# Patient Record
Sex: Male | Born: 1935 | Race: Black or African American | Hispanic: No | Smoking: Former smoker
Health system: Southern US, Community
[De-identification: ages and names within clinical notes are randomized; demographics above are authoritative.]

## PROBLEM LIST (undated history)

## (undated) DIAGNOSIS — I10 Essential (primary) hypertension: Secondary | ICD-10-CM

## (undated) DIAGNOSIS — E785 Hyperlipidemia, unspecified: Secondary | ICD-10-CM

## (undated) DIAGNOSIS — I519 Heart disease, unspecified: Secondary | ICD-10-CM

## (undated) HISTORY — PX: CORONARY ANGIOPLASTY: SHX604

---

## 2005-08-16 ENCOUNTER — Ambulatory Visit: Payer: Self-pay | Admitting: Gastroenterology

## 2006-01-12 ENCOUNTER — Emergency Department: Payer: Self-pay | Admitting: Internal Medicine

## 2009-11-17 ENCOUNTER — Emergency Department: Payer: Self-pay | Admitting: Emergency Medicine

## 2012-04-19 ENCOUNTER — Emergency Department: Payer: Self-pay | Admitting: Unknown Physician Specialty

## 2012-04-19 LAB — CBC
HCT: 37.7 % — ABNORMAL LOW (ref 40.0–52.0)
HGB: 11.9 g/dL — ABNORMAL LOW (ref 13.0–18.0)
MCHC: 31.7 g/dL — ABNORMAL LOW (ref 32.0–36.0)
MCV: 78 fL — ABNORMAL LOW (ref 80–100)
Platelet: 84 10*3/uL — ABNORMAL LOW (ref 150–440)
RBC: 4.85 10*6/uL (ref 4.40–5.90)
WBC: 3.9 10*3/uL (ref 3.8–10.6)

## 2012-04-19 LAB — URINALYSIS, COMPLETE
Glucose,UR: NEGATIVE mg/dL (ref 0–75)
Ketone: NEGATIVE
Leukocyte Esterase: NEGATIVE
Nitrite: NEGATIVE
Ph: 6 (ref 4.5–8.0)
Protein: NEGATIVE
RBC,UR: 1 /HPF (ref 0–5)
WBC UR: <1 /HPF (ref 0–5)

## 2012-04-19 LAB — COMPREHENSIVE METABOLIC PANEL
Alkaline Phosphatase: 63 U/L (ref 50–136)
Anion Gap: 7 (ref 7–16)
Calcium, Total: 8 mg/dL — ABNORMAL LOW (ref 8.5–10.1)
Chloride: 105 mmol/L (ref 98–107)
Creatinine: 1.29 mg/dL (ref 0.60–1.30)
Osmolality: 271 (ref 275–301)
Potassium: 4.2 mmol/L (ref 3.5–5.1)
SGPT (ALT): 24 U/L (ref 12–78)
Sodium: 134 mmol/L — ABNORMAL LOW (ref 136–145)
Total Protein: 6.7 g/dL (ref 6.4–8.2)

## 2012-04-19 LAB — TROPONIN I: Troponin-I: <0.02 ng/mL

## 2012-04-19 LAB — ETHANOL: Ethanol %: 0.076 % (ref 0.000–0.080)

## 2013-10-09 IMAGING — CT CT HEAD WITHOUT CONTRAST
1 series · 16 of 30 positions shown, 20 images · non-contrast
Comparison: none

REASON FOR EXAM: syncope
COMMENTS:

PROCEDURE:     CT  - CT HEAD WITHOUT CONTRAST  - April 19, 2012  [DATE]
RESULT:     Comparison:  None
TECHNIQUE: Multiple axial images from the foramen magnum to the vertex were
obtained without IV contrast.

[Series 2: soft tissue · axial · 0.44mm/px · z∈[+286,+426]mm · 16 of 32 slices shown, 20 images]
[im 2/32  brain]
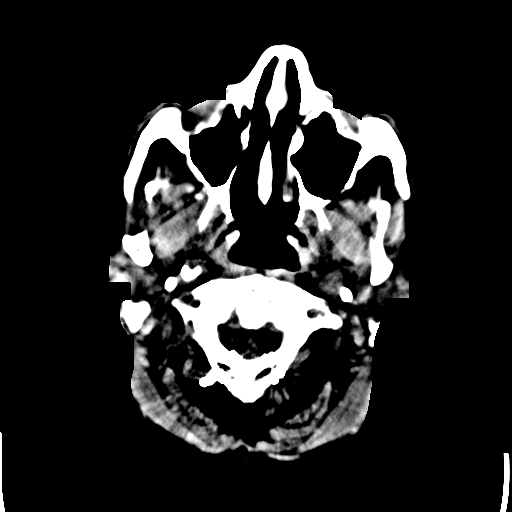
[im 2/32  bone]
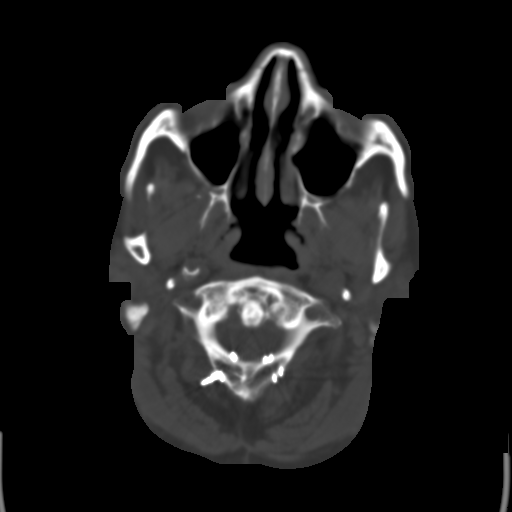
[im 4/32  brain]
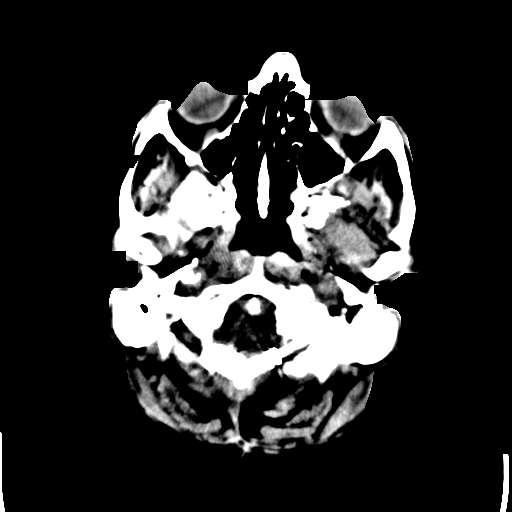
[im 6/32  brain]
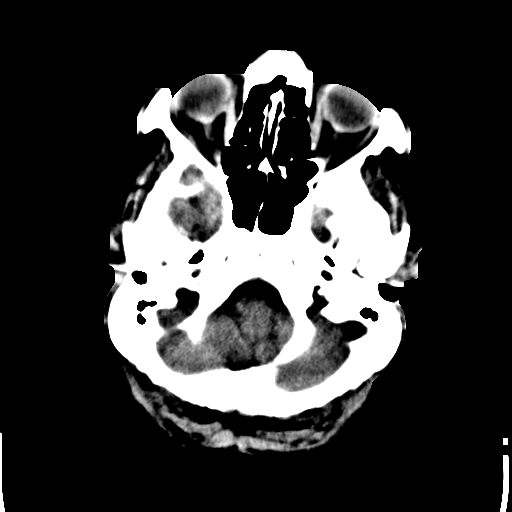
[im 8/32  brain]
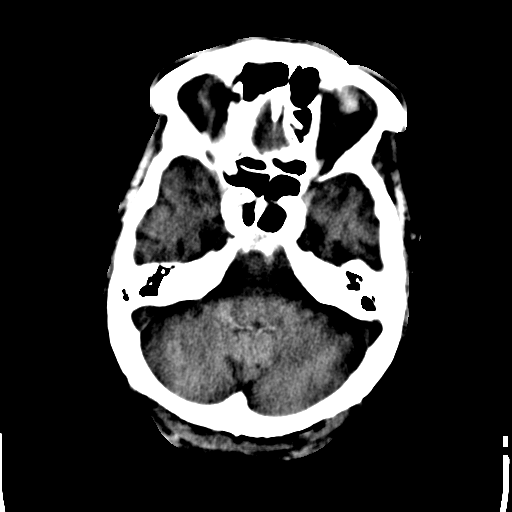
[im 9/32  brain]
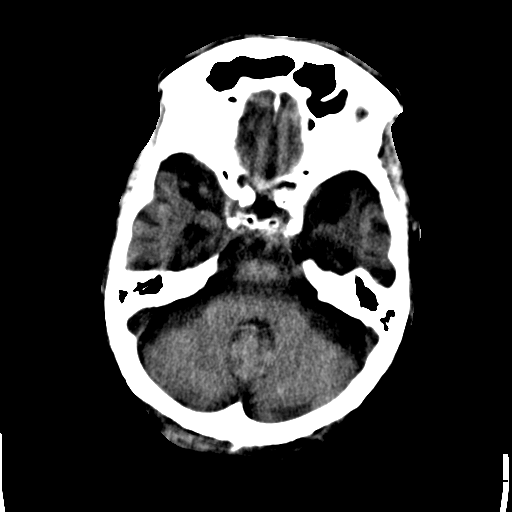
[im 9/32  bone]
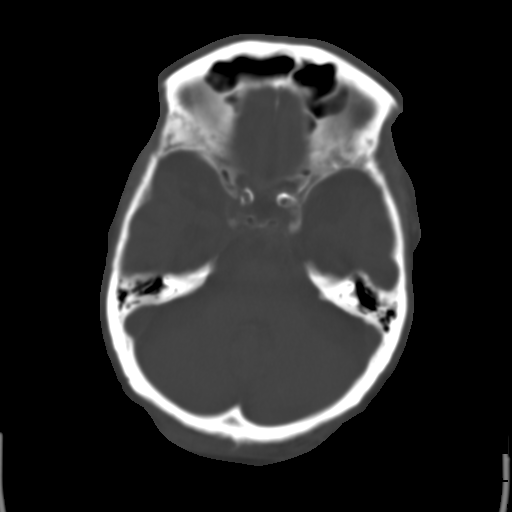
[im 11/32  brain]
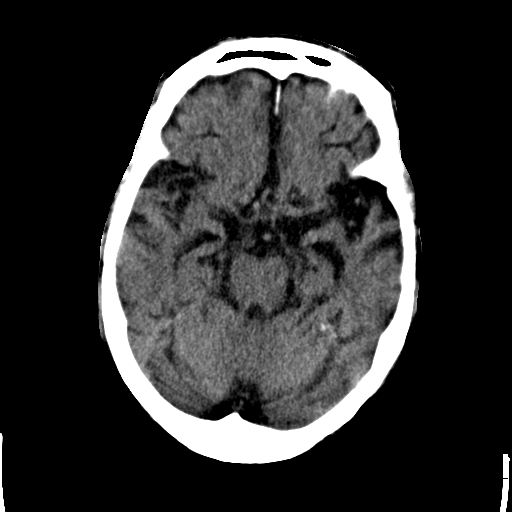
[im 13/32  brain]
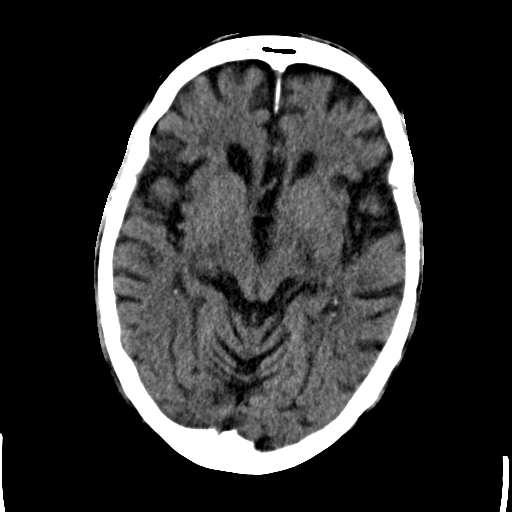
[im 15/32  brain]
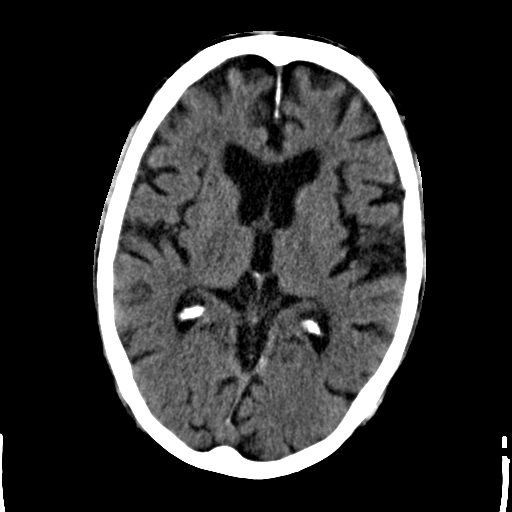
[im 17/32  brain]
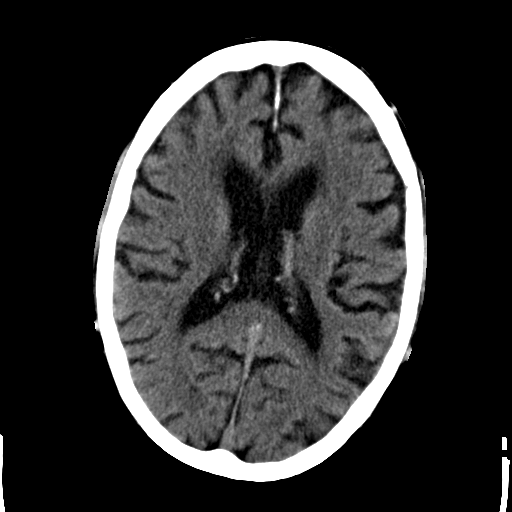
[im 17/32  bone]
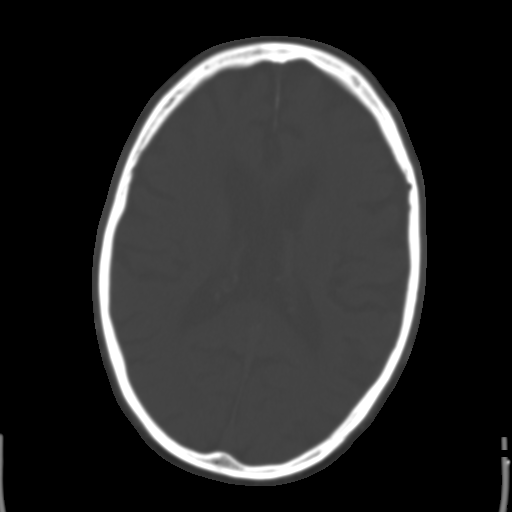
[im 19/32  brain]
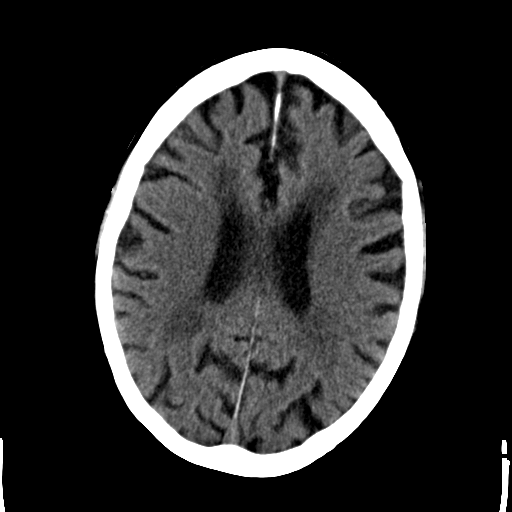
[im 21/32  brain]
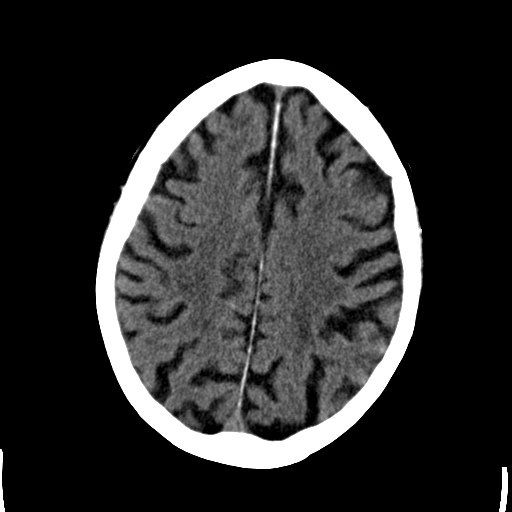
[im 23/32  brain]
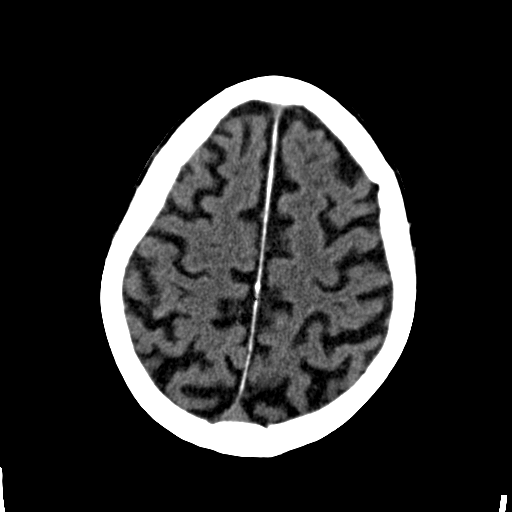
[im 24/32  brain]
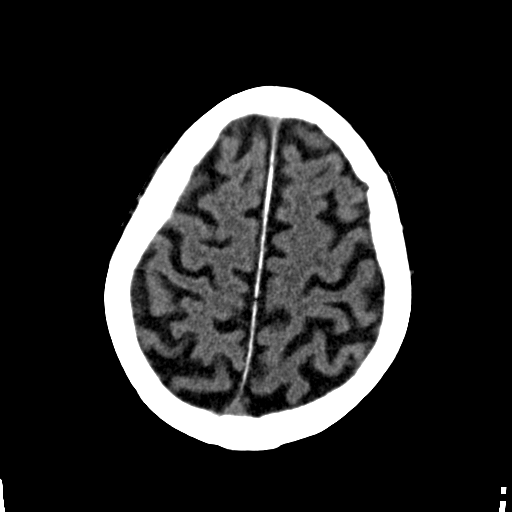
[im 24/32  bone]
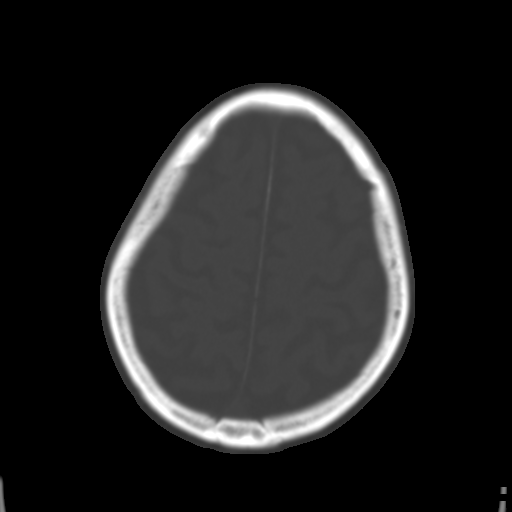
[im 26/32  brain]
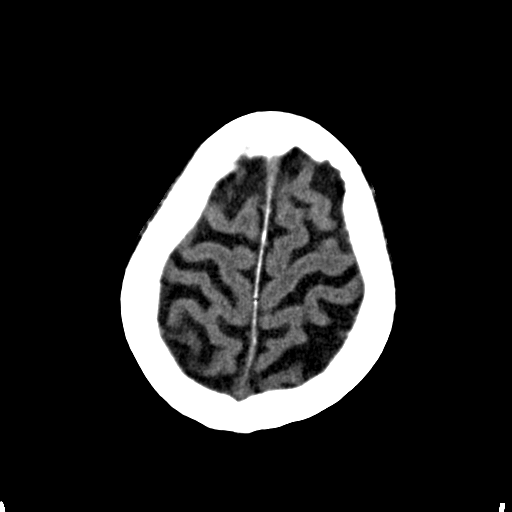
[im 28/32  brain]
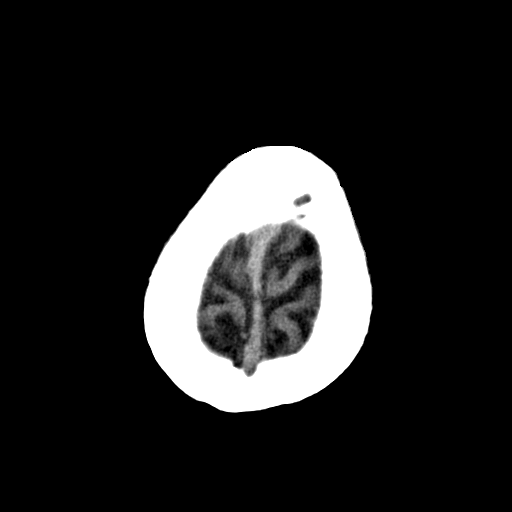
[im 30/32  brain]
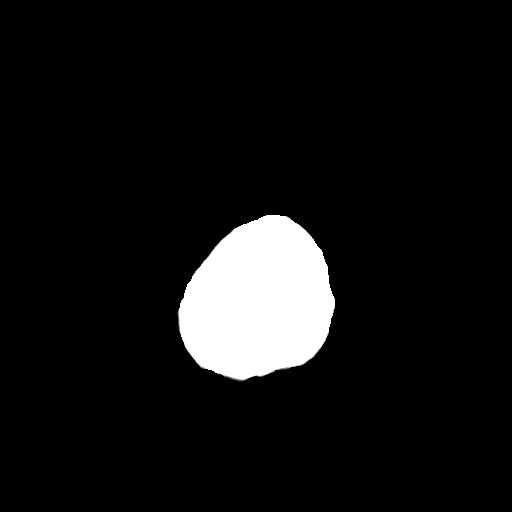

[16 of 30 positions shown; findings below may reference images not displayed]

FINDINGS: There is no evidence for mass effect, midline shift, or extra-axial fluid
collections. There is no evidence for space-occupying lesion, intracranial
hemorrhage, or cortical-based area of infarction. Periventricular and
subcortical hypoattenuation is consistent with chronic small vessel ischemic
disease. There is mild cerebral atrophy.

Cerclage wires are incompletely visualized around the posterior arches of
C1-C2.
IMPRESSION: 1. No acute intracranial process.
2. Chronic small vessel ischemic disease.

[REDACTED]

## 2013-12-23 ENCOUNTER — Other Ambulatory Visit: Payer: Self-pay | Admitting: Obstetrics and Gynecology

## 2014-09-23 ENCOUNTER — Encounter: Payer: Medicare Other | Attending: Cardiothoracic Surgery | Admitting: *Deleted

## 2014-09-23 VITALS — Ht 68.6 in | Wt 144.0 lb

## 2014-09-23 DIAGNOSIS — Z951 Presence of aortocoronary bypass graft: Secondary | ICD-10-CM | POA: Diagnosis not present

## 2014-09-23 NOTE — Progress Notes (Signed)
Daily Session Note  Patient Details  Name: Travis KOUDELKA Sr. MRN: [id number] Date of Birth: 04-12-1931 Referring Provider:  Gustavo Lah, MD  Encounter Date: 09/23/2014  Check In:     Session Check In - 09/23/14 1025    Check-In   Staff Present Gerlene Burdock RN, Drusilla Kanner MS, ACSM CEP Exercise Physiologist   ER physicians immediately available to respond to emergencies See telemetry face sheet for immediately available ER MD   Medication changes reported     No   Fall or balance concerns reported    No   Warm-up and Cool-down Performed on first and last piece of equipment   VAD Patient? No   Pain Assessment   Currently in Pain? No/denies         Goals Met:  Travis Webb did the 6 minute walk test today with no problems.   Goals Unmet:  Not Applicable  Goals Comments: Travis Webb was bought in by his daughter. Travis Webb is ready to exercise.    Dr. Emily Filbert is Medical Director for [facility].

## 2014-09-23 NOTE — Patient Instructions (Signed)
Patient Instructions  Patient Details  Name: Travis STARNER Sr. MRN: [id number] Date of Birth: August 14, 1931 Referring Provider:  Lottie Dawson, MD  Below are the personal goals you chose as well as exercise and nutrition goals. Our goal is to help you keep on track towards obtaining and maintaining your goals. We will be discussing your progress on these goals with you throughout the program.  Initial Exercise Prescription:   Exercise Goals: Frequency: Be able to perform aerobic exercise three times per week working toward 3-5 days per week.  Intensity: Work with a perceived exertion of 11 (fairly light) - 15 (hard) as tolerated. Follow your new exercise prescription and watch for changes in prescription as you progress with the program. Changes will be reviewed with you when they are made.  Duration: You should be able to do 30 minutes of continuous aerobic exercise in addition to a 5 minute warm-up and a 5 minute cool-down routine.  Nutrition Goals: Your personal nutrition goals will be established when you do your nutrition analysis with the dietician.  The following are nutrition guidelines to follow: Cholesterol < /day Sodium < /day Fiber: Men under 50 yrs - 38 grams per day  Personal Goals:     Personal Goals and Risk Factors at Admission - 09/23/14 1027    Personal Goals and Risk Factors on Admission    Weight Management Yes   Intervention Learn and follow the exercise and diet guidelines while in the program. Utilize the nutrition and education classes to help gain knowledge of the diet and exercise expectations in the program  Needs to gain weight   Increase Aerobic Exercise and Physical Activity Yes   Hypertension Yes   Goal Participant will see blood pressure controlled within the values of 140/44mm/Hg or within value directed by their physician.   Intervention Provide nutrition & aerobic exercise along with prescribed medications to achieve BP 140/90 or  less.   Lipids Yes   Goal Cholesterol controlled with medications as prescribed, with individualized exercise RX and with personalized nutrition plan. Value goals: LDL < , HDL > . Participant states understanding of desired cholesterol values and following prescriptions.   Intervention Provide nutrition & aerobic exercise along with prescribed medications to achieve LDL 70mg , HDL >40mg .      Tobacco Use Initial Evaluation: History  Smoking status  . Former Smoker -- 1.50 packs/day for 60 years  . Types: Cigarettes  . Quit date: 11/03/2013  Smokeless tobacco  . Not on file    Copy of goals given to participant.

## 2014-09-23 NOTE — Progress Notes (Signed)
Cardiac Individual Treatment Plan  Patient Details  Name: Travis STRIPLING Sr. MRN: [id number] Date of Birth: [birth year] Referring Provider:  Lottie Dawson, MD  Initial Encounter Date:    Visit Diagnosis: No diagnosis found.  Patient's Home Medications on Admission:  Current outpatient prescriptions:  .  atorvastatin (LIPITOR) 80 MG tablet, Take by mouth., Disp: , Rfl:  .  donepezil (ARICEPT) 5 MG tablet, Take by mouth., Disp: , Rfl:  .  metoprolol tartrate (LOPRESSOR) 25 MG tablet, Take by mouth., Disp: , Rfl:  .  nicotine (RA NICOTINE) 21 mg/24hr patch, Place onto the skin., Disp: , Rfl:  .  nitroGLYCERIN (NITROSTAT) 0.4 MG SL tablet, Place under the tongue., Disp: , Rfl:  .  tamsulosin (FLOMAX) 0.4 MG CAPS capsule, Take by mouth., Disp: , Rfl:   Past Medical History: No past medical history on file.  Tobacco Use: History  Smoking status  . Former Smoker -- 1.50 packs/day for 60 years  . Types: Cigarettes  . Quit date: 11/03/2013  Smokeless tobacco  . Not on file    Labs: Recent Review Flowsheet Data    There is no flowsheet data to display.       Exercise Target Goals:    Exercise Program Goal: Individual exercise prescription set with THRR, safety & activity barriers. Participant demonstrates ability to understand and report RPE using BORG scale, to self-measure pulse accurately, and to acknowledge the importance of the exercise prescription.  Exercise Prescription Goal: Starting with aerobic activity 30 plus minutes a day, 3 days per week for initial exercise prescription. Provide home exercise prescription and guidelines that participant acknowledges understanding prior to discharge.  Activity Barriers & Risk Stratification:     Activity Barriers & Risk Stratification - 09/23/14 1023    Activity Barriers & Risk Stratification   Activity Barriers Other (comment)  Travis Webb has dementia but does follow instructions and was able to do 6 minute walk test.     Risk Stratification High      6 Minute Walk:     6 Minute Walk      09/23/14 0917       6 Minute Walk   Phase Initial     Distance 1140 feet     Walk Time 6 minutes     Resting HR 70 bpm     Resting BP 130/70 mmHg     Max Ex. HR 109 bpm     Max Ex. BP 132/68 mmHg     RPE 15     Symptoms No        Initial Exercise Prescription:   Exercise Prescription Changes:   Discharge Exercise Prescription (Final Exercise Prescription Changes):   Nutrition:  Target Goals: Understanding of nutrition guidelines, daily intake of sodium 1500mg , cholesterol 200mg , calories 30% from fat and 7% or less from saturated fats, daily to have 5 or more servings of fruits and vegetables.  Biometrics:     Pre Biometrics - 09/23/14 0916    Pre Biometrics   Height 5' 8.6" (1.742 m)   Weight 144 lb (65.318 kg)   Waist Circumference 33.75 inches   Hip Circumference 35 inches   Waist to Hip Ratio 0.96 %   BMI (Calculated) 21.6       Nutrition Therapy Plan and Nutrition Goals:     Nutrition Therapy & Goals - 09/23/14 1026    Nutrition Therapy   Diet --  Travis Webb has dementia but his Daughter Travis Webb is intrested in sodium info etc.  Drug/Food Interactions Statins/Certain Fruits   Intervention Plan   Intervention Using nutrition plan and personal goals to gain a healthy nutrition lifestyle. Add exercise as prescribed.      Nutrition Discharge: Rate Your Plate Scores:   Nutrition Goals Re-Evaluation:   Psychosocial: Target Goals: Acknowledge presence or absence of depression, maximize coping skills, provide positive support system. Participant is able to verbalize types and ability to use techniques and skills needed for reducing stress and depression.  Initial Review & Psychosocial Screening:     Initial Psych Review & Screening - 09/23/14 1028    Initial Review   Current issues with Current Sleep Concerns  Travis Webb has dementia. His daughter is taking care of him and her  Grandchild full time.    Family Dynamics   Good Support System? Yes   Screening Interventions   Interventions Encouraged to exercise;Program counselor consult      Quality of Life Scores:   PHQ-9:     Recent Review Flowsheet Data    Depression screen [facility] 2/9 09/23/2014   Decreased Interest 0   Down, Depressed, Hopeless 1   PHQ - 2 Score 1   Altered sleeping 3   Tired, decreased energy 1   Change in appetite 1   Feeling bad or failure about yourself  0   Trouble concentrating 1   Moving slowly or fidgety/restless 1   Suicidal thoughts 0   PHQ-9 Score 8   Difficult doing work/chores Not difficult at all      Psychosocial Evaluation and Intervention:   Psychosocial Re-Evaluation:   Vocational Rehabilitation: Provide vocational rehab assistance to qualifying candidates.   Vocational Rehab Evaluation & Intervention:     Vocational Rehab - 09/23/14 1025    Initial Vocational Rehab Evaluation & Intervention   Assessment shows need for Vocational Rehabilitation No      Education: Education Goals: Education classes will be provided on a weekly basis, covering required topics. Participant will state understanding/return demonstration of topics presented.  Learning Barriers/Preferences:     Learning Barriers/Preferences - 09/23/14 1024    Learning Barriers/Preferences   Learning Barriers Reading;Hearing   Learning Preferences None      Education Topics: General Nutrition Guidelines/Fats and Fiber: -Group instruction provided by verbal, written material, models and posters to present the general guidelines for heart healthy nutrition. Gives an explanation and review of dietary fats and fiber.   Controlling Sodium/Reading Food Labels: -Group verbal and written material supporting the discussion of sodium use in heart healthy nutrition. Review and explanation with models, verbal and written materials for utilization of the food label.   Exercise Physiology &  Risk Factors: - Group verbal and written instruction with models to review the exercise physiology of the cardiovascular system and associated critical values. Details cardiovascular disease risk factors and the goals associated with each risk factor.   Aerobic Exercise & Resistance Training: - Gives group verbal and written discussion on the health impact of inactivity. On the components of aerobic and resistive training programs and the benefits of this training and how to safely progress through these programs.   Flexibility, Balance, General Exercise Guidelines: - Provides group verbal and written instruction on the benefits of flexibility and balance training programs. Provides general exercise guidelines with specific guidelines to those with heart or lung disease. Demonstration and skill practice provided.   Stress Management: - Provides group verbal and written instruction about the health risks of elevated stress, cause of high stress, and healthy ways to reduce stress.  Depression: - Provides group verbal and written instruction on the correlation between heart/lung disease and depressed mood, treatment options, and the stigmas associated with seeking treatment.   Anatomy & Physiology of the Heart: - Group verbal and written instruction and models provide basic cardiac anatomy and physiology, with the coronary electrical and arterial systems. Review of: AMI, Angina, Valve disease, Heart Failure, Cardiac Arrhythmia, Pacemakers, and the ICD.   Cardiac Procedures: - Group verbal and written instruction and models to describe the testing methods done to diagnose heart disease. Reviews the outcomes of the test results. Describes the treatment choices: Medical Management, Angioplasty, or Coronary Bypass Surgery.   Cardiac Medications: - Group verbal and written instruction to review commonly prescribed medications for heart disease. Reviews the medication, class of the drug, and  side effects. Includes the steps to properly store meds and maintain the prescription regimen.   Go Sex-Intimacy & Heart Disease, Get SMART - Goal Setting: - Group verbal and written instruction through game format to discuss heart disease and the return to sexual intimacy. Provides group verbal and written material to discuss and apply goal setting through the application of the S.M.A.R.T. Method.   Other Matters of the Heart: - Provides group verbal, written materials and models to describe Heart Failure, Angina, Valve Disease, and Diabetes in the realm of heart disease. Includes description of the disease process and treatment options available to the cardiac patient.   Exercise & Equipment Safety: - Individual verbal instruction and demonstration of equipment use and safety with use of the equipment.          Cardiac Rehab from 09/23/2014 in [facility]   Date  09/23/14   Educator  C. Janai Maudlin,RN   Instruction Review Code  1- partially meets, needs review/practice      Infection Prevention: - Provides verbal and written material to individual with discussion of infection control including proper hand washing and proper equipment cleaning during exercise session.      Cardiac Rehab from 09/23/2014 in [facility]iac [facility]   Date  09/23/14   Educator  C. Talor Cheema,RN   Instruction Review Code  2- meets goals/outcomes      Falls Prevention: - Provides verbal and written material to individual with discussion of falls prevention and safety.      Cardiac Rehab from 09/23/2014 in [facility]iac [facility]hab   Date  09/23/14   Educator  C. Catheryn Slifer,RN   Instruction Review Code  1- partially meets, needs review/practice      Diabetes: - Individual verbal and written instruction to review signs/symptoms of diabetes, desired ranges of glucose level fasting, after meals and with exercise. Advice that pre and post exercise glucose checks will be done for 3 sessions at entry of  program.    Knowledge Questionnaire Score:     Knowledge Questionnaire Score - 09/23/14 1025    Knowledge Questionnaire Score   Pre Score --  Not done-due to Travis Webb having dementia      Personal Goals and Risk Factors at Admission:     Personal Goals and Risk Factors at Admission - 09/23/14 1027    Personal Goals and Risk Factors on Admission    Weight Management Yes   Intervention Learn and follow the exercise and diet guidelines while in the program. Utilize the nutrition and education classes to help gain knowledge of the diet and exercise expectations in the program  Needs to gain weight   Increase Aerobic Exercise and Physical Activity Yes   Hypertension  Yes   Goal Participant will see blood pressure controlled within the values of 140/90mm/Hg or within value directed by their physician.   Intervention Provide nutrition & aerobic exercise along with prescribed medications to achieve BP 140/90 or less.   Lipids Yes   Goal Cholesterol controlled with medications as prescribed, with individualized exercise RX and with personalized nutrition plan. Value goals: LDL < , HDL > . Participant states understanding of desired cholesterol values and following prescriptions.   Intervention Provide nutrition & aerobic exercise along with prescribed medications to achieve LDL 70mg , HDL >40mg .      Personal Goals and Risk Factors Review:    Personal Goals Discharge (Final Personal Goals and Risk Factors Review):     Comments: Travis Webb should benefit from Cardiac Rehab and increase his strength. Travis Webb will a2mso benefit by having contact with others in the class and activity for him during his days.

## 2014-09-25 DIAGNOSIS — Z951 Presence of aortocoronary bypass graft: Secondary | ICD-10-CM | POA: Diagnosis not present

## 2014-09-25 NOTE — Progress Notes (Signed)
Cardiac Individual Treatment Plan  Patient Details  Name: NAMON VILLARIN Sr. MRN: [id number] Date of Birth: 12-15-1931 Referring Provider:  Lottie Dawson, MD  Initial Encounter Date:    Visit Diagnosis: No diagnosis found.  Patient's Home Medications on Admission:  Current outpatient prescriptions:  .  atorvastatin (LIPITOR) 80 MG tablet, Take by mouth., Disp: , Rfl:  .  donepezil (ARICEPT) 5 MG tablet, Take by mouth., Disp: , Rfl:  .  metoprolol tartrate (LOPRESSOR) 25 MG tablet, Take by mouth., Disp: , Rfl:  .  nicotine (RA NICOTINE) 21 mg/24hr patch, Place onto the skin., Disp: , Rfl:  .  nitroGLYCERIN (NITROSTAT) 0.4 MG SL tablet, Place under the tongue., Disp: , Rfl:  .  tamsulosin (FLOMAX) 0.4 MG CAPS capsule, Take by mouth., Disp: , Rfl:   Past Medical History: No past medical history on file.  Tobacco Use: History  Smoking status  . Former Smoker -- 1.50 packs/day for 60 years  . Types: Cigarettes  . Quit date: 11/03/2013  Smokeless tobacco  . Not on file    Labs: Recent Review Flowsheet Data    There is no flowsheet data to display.       Exercise Target Goals:    Exercise Program Goal: Individual exercise prescription set with THRR, safety & activity barriers. Participant demonstrates ability to understand and report RPE using BORG scale, to self-measure pulse accurately, and to acknowledge the importance of the exercise prescription.  Exercise Prescription Goal: Starting with aerobic activity 30 plus minutes a day, 3 days per week for initial exercise prescription. Provide home exercise prescription and guidelines that participant acknowledges understanding prior to discharge.  Activity Barriers & Risk Stratification:     Activity Barriers & Risk Stratification - 09/23/14 1023    Activity Barriers & Risk Stratification   Activity Barriers Other (comment)  Naaman has dementia but does follow instructions and was able to do 6 minute walk test.    Risk Stratification High      6 Minute Walk:     6 Minute Walk      09/23/14 0917       6 Minute Walk   Phase Initial     Distance 1140 feet     Walk Time 6 minutes     Resting HR 70 bpm     Resting BP 130/70 mmHg     Max Ex. HR 109 bpm     Max Ex. BP 132/68 mmHg     RPE 15     Symptoms No        Initial Exercise Prescription:     Initial Exercise Prescription - 09/23/14 1100    Date of Initial Exercise Prescription   Date 09/23/14   Treadmill   MPH 1.8   Grade 0   Minutes 10   Bike   Level 0.4   Minutes 10   Recumbant Bike   Level 2   RPM 40   Watts 20   Minutes 15   NuStep   Level 2   Watts 30   Minutes 15   Arm Ergometer   Level 1   Watts 8   Minutes 10   Arm/Foot Ergometer   Level 4   Watts 15   Minutes 10   Cybex   Level 3   RPM 60   Minutes 10   Recumbant Elliptical   Level 1   RPM 40   Watts 10   Minutes 10   REL-XR  Level 1   Watts 30   Minutes 15   Prescription Details   Frequency (times per week) 3   Duration Progress to 30 minutes of continuous aerobic without signs/symptoms of physical distress   Intensity   THRR REST +  30   Ratings of Perceived Exertion 11-15   Progression Continue progressive overload as per policy without signs/symptoms or physical distress.   Resistance Training   Training Prescription Yes   Weight 2   Reps 10-15      Exercise Prescription Changes:   Discharge Exercise Prescription (Final Exercise Prescription Changes):   Nutrition:  Target Goals: Understanding of nutrition guidelines, daily intake of sodium 1500mg , cholesterol 200mg , calories 30% from fat and 7% or less from saturated fats, daily to have 5 or more servings of fruits and vegetables.  Biometrics:     Pre Biometrics - 09/23/14 0916    Pre Biometrics   Height 5' 8.6" (1.742 m)   Weight 144 lb (65.318 kg)   Waist Circumference 33.75 inches   Hip Circumference 35 inches   Waist to Hip Ratio 0.96 %   BMI (Calculated)  21.6       Nutrition Therapy Plan and Nutrition Goals:     Nutrition Therapy & Goals - 09/23/14 1026    Nutrition Therapy   Diet --  Kaidence has dementia but his Daughter [name] is intrested in sodium info etc.   Drug/Food Interactions Statins/Certain Fruits   Intervention Plan   Intervention Using nutrition plan and personal goals to gain a healthy nutrition lifestyle. Add exercise as prescribed.      Nutrition Discharge: Rate Your Plate Scores:   Nutrition Goals Re-Evaluation:     Nutrition Goals Re-Evaluation      09/25/14 1216           Personal Goal #1 Re-Evaluation   Personal Goal #1 Pam left [name] [name] ) daughter a vm since daughter wants to know more informationa about sodium and meet with the dietician. I mentioned 1500 mg daily sodium intake is recommended. Her father could gain weight actually but has a decreased appetitie compared to when he used to work.        Goal Progress Seen Yes          Psychosocial: Target Goals: Acknowledge presence or absence of depression, maximize coping skills, provide positive support system. Participant is able to verbalize types and ability to use techniques and skills needed for reducing stress and depression.  Initial Review & Psychosocial Screening:     Initial Psych Review & Screening - 09/23/14 1028    Initial Review   Current issues with Current Sleep Concerns  Nasean has dementia. His daughter is taking care of him and her Grandchild full time.    Family Dynamics   Good Support System? Yes   Screening Interventions   Interventions Encouraged to exercise;Program counselor consult      Quality of Life Scores:     Quality of Life - 09/23/14 1241    Quality of Life Scores   Health/Function Pre 24.11 %  Duaghter asked Sal Spratley these questions.   Socioeconomic Pre 27.08 %   Psych/Spiritual Pre 23.79 %   Family Pre 27.25 %   GLOBAL Pre 25.02 %      PHQ-9:     Recent Review Flowsheet Data     Depression screen [facility] 2/9 09/23/2014   Decreased Interest 0   Down, Depressed, Hopeless 1   PHQ - 2 Score 1  Altered sleeping 3   Tired, decreased energy 1   Change in appetite 1   Feeling bad or failure about yourself  0   Trouble concentrating 1   Moving slowly or fidgety/restless 1   Suicidal thoughts 0   PHQ-9 Score 8   Difficult doing work/chores Not difficult at all      Psychosocial Evaluation and Intervention:   Psychosocial Re-Evaluation:   Vocational Rehabilitation: Provide vocational rehab assistance to qualifying candidates.   Vocational Rehab Evaluation & Intervention:     Vocational Rehab - 09/23/14 1025    Initial Vocational Rehab Evaluation & Intervention   Assessment shows need for Vocational Rehabilitation No      Education: Education Goals: Education classes will be provided on a weekly basis, covering required topics. Participant will state understanding/return demonstration of topics presented.  Learning Barriers/Preferences:     Learning Barriers/Preferences - 09/23/14 1024    Learning Barriers/Preferences   Learning Barriers Reading;Hearing   Learning Preferences None      Education Topics: General Nutrition Guidelines/Fats and Fiber: -Group instruction provided by verbal, written material, models and posters to present the general guidelines for heart healthy nutrition. Gives an explanation and review of dietary fats and fiber.   Controlling Sodium/Reading Food Labels: -Group verbal and written material supporting the discussion of sodium use in heart healthy nutrition. Review and explanation with models, verbal and written materials for utilization of the food label.   Exercise Physiology & Risk Factors: - Group verbal and written instruction with models to review the exercise physiology of the cardiovascular system and associated critical values. Details cardiovascular disease risk factors and the goals associated with each risk  factor.   Aerobic Exercise & Resistance Training: - Gives group verbal and written discussion on the health impact of inactivity. On the components of aerobic and resistive training programs and the benefits of this training and how to safely progress through these programs.   Flexibility, Balance, General Exercise Guidelines: - Provides group verbal and written instruction on the benefits of flexibility and balance training programs. Provides general exercise guidelines with specific guidelines to those with heart or lung disease. Demonstration and skill practice provided.   Stress Management: - Provides group verbal and written instruction about the health risks of elevated stress, cause of high stress, and healthy ways to reduce stress.          Cardiac Rehab from 09/25/2014 in [facility] Cardiac Rehab   Date  09/25/14   Educator  C. Enterkin, RN   Instruction Review Code  2- meets goals/outcomes      Depression: - Provides group verbal and written instruction on the correlation between heart/lung disease and depressed mood, treatment options, and the stigmas associated with seeking treatment.   Anatomy & Physiology of the Heart: - Group verbal and written instruction and models provide basic cardiac anatomy and physiology, with the coronary electrical and arterial systems. Review of: AMI, Angina, Valve disease, Heart Failure, Cardiac Arrhythmia, Pacemakers, and the ICD.   Cardiac Procedures: - Group verbal and written instruction and models to describe the testing methods done to diagnose heart disease. Reviews the outcomes of the test results. Describes the treatment choices: Medical Management, Angioplasty, or Coronary Bypass Surgery.   Cardiac Medications: - Group verbal and written instruction to review commonly prescribed medications for heart disease. Reviews the medication, class of the drug, and side effects. Includes the steps to properly store meds and maintain the  prescription regimen.   Go Sex-Intimacy & Heart  Disease, Get SMART - Goal Setting: - Group verbal and written instruction through game format to discuss heart disease and the return to sexual intimacy. Provides group verbal and written material to discuss and apply goal setting through the application of the S.M.A.R.T. Method.   Other Matters of the Heart: - Provides group verbal, written materials and models to describe Heart Failure, Angina, Valve Disease, and Diabetes in the realm of heart disease. Includes description of the disease process and treatment options available to the cardiac patient.   Exercise & Equipment Safety: - Individual verbal instruction and demonstration of equipment use and safety with use of the equipment.      Cardiac Rehab from 09/25/2014 in [facility] Cardiac Rehab   Date  09/23/14   Educator  C. Enterkin,RN   Instruction Review Code  1- partially meets, needs review/practice      Infection Prevention: - Provides verbal and written material to individual with discussion of infection control including proper hand washing and proper equipment cleaning during exercise session.      Cardiac Rehab from 09/25/2014 in [facility] Cardiac Rehab   Date  09/23/14   Educator  C. Enterkin,RN   Instruction Review Code  2- meets goals/outcomes      Falls Prevention: - Provides verbal and written material to individual with discussion of falls prevention and safety.      Cardiac Rehab from 09/25/2014 in [facility] Cardiac Rehab   Date  09/23/14   Educator  C. Enterkin,RN   Instruction Review Code  1- partially meets, needs review/practice      Diabetes: - Individual verbal and written instruction to review signs/symptoms of diabetes, desired ranges of glucose level fasting, after meals and with exercise. Advice that pre and post exercise glucose checks will be done for 3 sessions at entry of program.    Knowledge Questionnaire Score:     Knowledge Questionnaire Score - 09/23/14  1025    Knowledge Questionnaire Score   Pre Score --  Not done-due to [name] having dementia      Personal Goals and Risk Factors at Admission:     Personal Goals and Risk Factors at Admission - 09/23/14 1027    Personal Goals and Risk Factors on Admission    Weight Management Yes   Intervention Learn and follow the exercise and diet guidelines while in the program. Utilize the nutrition and education classes to help gain knowledge of the diet and exercise expectations in the program  Needs to gain weight   Increase Aerobic Exercise and Physical Activity Yes   Hypertension Yes   Goal Participant will see blood pressure controlled within the values of 140/66mm/Hg or within value directed by their physician.   Intervention Provide nutrition & aerobic exercise along with prescribed medications to achieve BP 140/90 or less.   Lipids Yes   Goal Cholesterol controlled with medications as prescribed, with individualized exercise RX and with personalized nutrition plan. Value goals: LDL < , HDL > . Participant states understanding of desired cholesterol values and following prescriptions.   Intervention Provide nutrition & aerobic exercise along with prescribed medications to achieve LDL 70mg , HDL >40mg .      Personal Goals and Risk Factors Review:      Goals and Risk Factor Review      09/25/14 1217           Increase Aerobic Exercise and Physical Activity   Goals Progress/Improvement seen  Yes       Comments Exavier has dementia so  exercise specialist spent a great deal of time with him explaining and setting him up on the Nustep. Davidson was able to exercise on the Nustep plus on the REL XR6000. Jimmey liked the XR6000.           Personal Goals Discharge (Final Personal Goals and Risk Factors Review):      Goals and Risk Factor Review - 09/25/14 1217    Increase Aerobic Exercise and Physical Activity   Goals Progress/Improvement seen  Yes   Comments Shondell has dementia so  exercise specialist spent a great deal of time with him explaining and setting him up on the Nustep. Latravious was able to exercise on the Nustep plus on the REL XR6000. Knight liked the XR6000.        Comments: Darrin was encouraged to stay to do the resistance training with the small hand weights after the REL aerobic Cardiac Rehab exercise he got.

## 2014-09-25 NOTE — Progress Notes (Signed)
Daily Session Note  Patient Details  Name: Travis TERHUNE Sr. MRN: [id number] Date of Birth: 07-Dec-1931 Referring Provider:  No ref. provider found  Encounter Date: 09/25/2014  Check In:     Session Check In - 09/25/14 0859    Check-In   Staff Present Gerlene Burdock RN, BSN;Steven Way BS, ACSM EP-C, Exercise Physiologist;Other   ER physicians immediately available to respond to emergencies See telemetry face sheet for immediately available ER MD   Medication changes reported     No   Fall or balance concerns reported    No   Warm-up and Cool-down Performed on first and last piece of equipment   VAD Patient? No   Pain Assessment   Currently in Pain? No/denies         Goals Met:  Exercise tolerated well No report of cardiac concerns or symptoms Strength training completed today  Goals Unmet:  Not Applicable  Goals Comments:    Dr. Emily Filbert is Medical Director for [place] and [facility].

## 2014-09-30 DIAGNOSIS — Z951 Presence of aortocoronary bypass graft: Secondary | ICD-10-CM | POA: Diagnosis not present

## 2014-09-30 NOTE — Progress Notes (Signed)
Daily Session Note  Patient Details  Name: DELMORE SEAR Sr. MRN: [id number] Date of Birth: December 15, 1931 Referring Provider:  Gustavo Lah, MD  Encounter Date: 09/30/2014  Check In:     Session Check In - 09/30/14 1021    Check-In   Staff Present Diane Joya Gaskins RN, BSN;Other;Renee Dillard Essex MS, ACSM CEP Exercise Physiologist   ER physicians immediately available to respond to emergencies See telemetry face sheet for immediately available ER MD   Medication changes reported     No   Fall or balance concerns reported    No   Warm-up and Cool-down Performed on first and last piece of equipment   VAD Patient? No   Pain Assessment   Currently in Pain? No/denies         Goals Met:  Exercise tolerated well No report of cardiac concerns or symptoms Strength training completed today  Goals Unmet:  Not Applicable  Goals Comments:    Dr. Emily Filbert is Medical Director for [place] and [facility].

## 2014-10-02 DIAGNOSIS — Z951 Presence of aortocoronary bypass graft: Secondary | ICD-10-CM | POA: Diagnosis not present

## 2014-10-02 NOTE — Progress Notes (Signed)
Daily Session Note  Patient Details  Name: JOBANY MONTELLANO Sr. MRN: [id number] Date of Birth: 05-21-1931 Referring Provider:  Gustavo Lah, MD  Encounter Date: 10/02/2014  Check In:     Session Check In - 10/02/14 1505    Check-In   Staff Present Frederich Balding MPH, CHES;Other;Carroll Enterkin RN, BSN   ER physicians immediately available to respond to emergencies See telemetry face sheet for immediately available ER MD   Medication changes reported     No   Fall or balance concerns reported    No   Warm-up and Cool-down Performed on first and last piece of equipment   VAD Patient? No   Pain Assessment   Currently in Pain? No/denies         Goals Met:  Exercise tolerated well No report of cardiac concerns or symptoms Strength training completed today  Goals Unmet:  Not Applicable  Goals Comments: Patient does better if exercising is in 10 minute intervals with a 2-5 minute break before restarting exercise due to dementia.    Dr. Emily Filbert is Medical Director for [place] and [facility].

## 2014-10-02 NOTE — Progress Notes (Signed)
Cardiac Individual Treatment Plan  Patient Details  Name: Travis Webb. MRN: [id number] Date of Birth: 12/18/1931 Referring Provider:  Lottie Dawson, MD  Initial Encounter Date: Date: 09/23/14  Visit Diagnosis: No diagnosis found.  Patient's Home Medications on Admission:  Current outpatient prescriptions:  .  atorvastatin (LIPITOR) 80 MG tablet, Take by mouth., Disp: , Rfl:  .  donepezil (ARICEPT) 5 MG tablet, Take by mouth., Disp: , Rfl:  .  metoprolol tartrate (LOPRESSOR) 25 MG tablet, Take by mouth., Disp: , Rfl:  .  nicotine (RA NICOTINE) 21 mg/24hr patch, Place onto the skin., Disp: , Rfl:  .  nitroGLYCERIN (NITROSTAT) 0.4 MG SL tablet, Place under the tongue., Disp: , Rfl:  .  tamsulosin (FLOMAX) 0.4 MG CAPS capsule, Take by mouth., Disp: , Rfl:   Past Medical History: No past medical history on file.  Tobacco Use: History  Smoking status  . Former Smoker -- 1.50 packs/day for 60 years  . Types: Cigarettes  . Quit date: 11/03/2013  Smokeless tobacco  . Not on file    Labs: Recent Review Flowsheet Data    There is no flowsheet data to display.       Exercise Target Goals: Date: 09/23/14  Exercise Program Goal: Individual exercise prescription set with THRR, safety & activity barriers. Participant demonstrates ability to understand and report RPE using BORG scale, to self-measure pulse accurately, and to acknowledge the importance of the exercise prescription.  Exercise Prescription Goal: Starting with aerobic activity 30 plus minutes a day, 3 days per week for initial exercise prescription. Provide home exercise prescription and guidelines that participant acknowledges understanding prior to discharge.  Activity Barriers & Risk Stratification:     Activity Barriers & Risk Stratification - 09/23/14 1023    Activity Barriers & Risk Stratification   Activity Barriers Other (comment)  Wilferd has dementia but does follow instructions and was able to do 6  minute walk test.    Risk Stratification High      6 Minute Walk:     6 Minute Walk      09/23/14 0917       6 Minute Walk   Phase Initial     Distance 1140 feet     Walk Time 6 minutes     Resting HR 70 bpm     Resting BP 130/70 mmHg     Max Ex. HR 109 bpm     Max Ex. BP 132/68 mmHg     RPE 15     Symptoms No        Initial Exercise Prescription:     Initial Exercise Prescription - 10/02/14 1000    Date of Initial Exercise Prescription   Date 09/23/14   Treadmill   MPH 1.8   Grade 0   Minutes 10   Bike   Level --   Minutes --   Recumbant Bike   Level --   RPM --   Watts --   Minutes --   NuStep   Level 2   Watts 30   Minutes 10  Does better with then 5 minutes   Arm Ergometer   Level 1   Watts 8   Minutes 10   Arm/Foot Ergometer   Level --   Watts --   Minutes --   Cybex   Level 3   RPM 60   Minutes 10   Recumbant Elliptical   Level --   RPM --   Watts --  Minutes --   REL-XR   Level 1   Watts 30   Minutes 15   Prescription Details   Frequency (times per week) 3   Intensity   THRR REST +  30   Ratings of Perceived Exertion 11-15   Progression Continue progressive overload as per policy without signs/symptoms or physical distress.   Resistance Training   Training Prescription Yes   Weight 2   Reps 10-15      Exercise Prescription Changes:     Exercise Prescription Changes      10/02/14 1000 10/02/14 1022         NuStep   Level 2 2      Watts 45 45      Minutes 10  Does well with , rest then 5 minutes 10  Does well with , rest then 5 minutes         Discharge Exercise Prescription (Final Exercise Prescription Changes):     Exercise Prescription Changes - 10/02/14 1022    NuStep   Level 2   Watts 45   Minutes 10  Does well with , rest then 5 minutes      Nutrition:  Target Goals: Understanding of nutrition guidelines, daily intake of sodium 1500mg , cholesterol 200mg , calories 30% from  fat and 7% or less from saturated fats, daily to have 5 or more servings of fruits and vegetables.  Biometrics:     Pre Biometrics - 09/23/14 0916    Pre Biometrics   Height 5' 8.6" (1.742 m)   Weight 144 lb (65.318 kg)   Waist Circumference 33.75 inches   Hip Circumference 35 inches   Waist to Hip Ratio 0.96 %   BMI (Calculated) 21.6       Nutrition Therapy Plan and Nutrition Goals:     Nutrition Therapy & Goals - 09/23/14 1026    Nutrition Therapy   Diet --  Donielle has dementia but his Daughter [name] is intrested in sodium info etc.   Drug/Food Interactions Statins/Certain Fruits   Intervention Plan   Intervention Using nutrition plan and personal goals to gain a healthy nutrition lifestyle. Add exercise as prescribed.      Nutrition Discharge: Rate Your Plate Scores:   Nutrition Goals Re-Evaluation:     Nutrition Goals Re-Evaluation      09/25/14 1216 10/02/14 1022         Personal Goal #1 Re-Evaluation   Personal Goal #1 Pam left [name] [name] ) daughter a vm since daughter wants to know more informationa about sodium and meet with the dietician. I mentioned 1500 mg daily sodium intake is recommended. Her father could gain weight actually but has a decreased appetitie compared to when he used to work.        Goal Progress Seen Yes       Comments  Pam has left daughter a vm. I will give her information about sodium.          Psychosocial: Target Goals: Acknowledge presence or absence of depression, maximize coping skills, provide positive support system. Participant is able to verbalize types and ability to use techniques and skills needed for reducing stress and depression.  Initial Review & Psychosocial Screening:     Initial Psych Review & Screening - 09/23/14 1028    Initial Review   Current issues with Current Sleep Concerns  Akshat has dementia. His daughter is taking care of him and her Grandchild full time.    Family Dynamics  Good Support  System? Yes   Screening Interventions   Interventions Encouraged to exercise;Program counselor consult      Quality of Life Scores:     Quality of Life - 09/23/14 1241    Quality of Life Scores   Health/Function Pre 24.11 %  Duaghter asked Baylin Gamblin these questions.   Socioeconomic Pre 27.08 %   Psych/Spiritual Pre 23.79 %   Family Pre 27.25 %   GLOBAL Pre 25.02 %      PHQ-9:     Recent Review Flowsheet Data    Depression screen [facility] 2/9 09/23/2014   Decreased Interest 0   Down, Depressed, Hopeless 1   PHQ - 2 Score 1   Altered sleeping 3   Tired, decreased energy 1   Change in appetite 1   Feeling bad or failure about yourself  0   Trouble concentrating 1   Moving slowly or fidgety/restless 1   Suicidal thoughts 0   PHQ-9 Score 8   Difficult doing work/chores Not difficult at all      Psychosocial Evaluation and Intervention:   Psychosocial Re-Evaluation:     Psychosocial Re-Evaluation      10/02/14 1024           Psychosocial Re-Evaluation   Interventions Encouraged to attend Cardiac Rehabilitation for the exercise       Comments --  Daughter given info about Adult Day Care programs          Vocational Rehabilitation: Provide vocational rehab assistance to qualifying candidates.   Vocational Rehab Evaluation & Intervention:     Vocational Rehab - 09/23/14 1025    Initial Vocational Rehab Evaluation & Intervention   Assessment shows need for Vocational Rehabilitation No      Education: Education Goals: Education classes will be provided on a weekly basis, covering required topics. Participant will state understanding/return demonstration of topics presented.  Learning Barriers/Preferences:     Learning Barriers/Preferences - 09/23/14 1024    Learning Barriers/Preferences   Learning Barriers Reading;Hearing   Learning Preferences None      Education Topics: General Nutrition Guidelines/Fats and Fiber: -Group instruction provided by  verbal, written material, models and posters to present the general guidelines for heart healthy nutrition. Gives an explanation and review of dietary fats and fiber.   Controlling Sodium/Reading Food Labels: -Group verbal and written material supporting the discussion of sodium use in heart healthy nutrition. Review and explanation with models, verbal and written materials for utilization of the food label.   Exercise Physiology & Risk Factors: - Group verbal and written instruction with models to review the exercise physiology of the cardiovascular system and associated critical values. Details cardiovascular disease risk factors and the goals associated with each risk factor.   Aerobic Exercise & Resistance Training: - Gives group verbal and written discussion on the health impact of inactivity. On the components of aerobic and resistive training programs and the benefits of this training and how to safely progress through these programs.   Flexibility, Balance, General Exercise Guidelines: - Provides group verbal and written instruction on the benefits of flexibility and balance training programs. Provides general exercise guidelines with specific guidelines to those with heart or lung disease. Demonstration and skill practice provided.   Stress Management: - Provides group verbal and written instruction about the health risks of elevated stress, cause of high stress, and healthy ways to reduce stress.          Cardiac Rehab from 10/02/2014 in [facility] Cardiac Rehab  Date  09/25/14   Educator  C. Enterkin, RN   Instruction Review Code  2- meets goals/outcomes      Depression: - Provides group verbal and written instruction on the correlation between heart/lung disease and depressed mood, treatment options, and the stigmas associated with seeking treatment.   Anatomy & Physiology of the Heart: - Group verbal and written instruction and models provide basic cardiac anatomy and  physiology, with the coronary electrical and arterial systems. Review of: AMI, Angina, Valve disease, Heart Failure, Cardiac Arrhythmia, Pacemakers, and the ICD.      Cardiac Rehab from 10/02/2014 in [facility] Cardiac Rehab   Date  09/30/14   Educator  DW   Instruction Review Code  2- meets goals/outcomes      Cardiac Procedures: - Group verbal and written instruction and models to describe the testing methods done to diagnose heart disease. Reviews the outcomes of the test results. Describes the treatment choices: Medical Management, Angioplasty, or Coronary Bypass Surgery.   Cardiac Medications: - Group verbal and written instruction to review commonly prescribed medications for heart disease. Reviews the medication, class of the drug, and side effects. Includes the steps to properly store meds and maintain the prescription regimen.   Go Sex-Intimacy & Heart Disease, Get SMART - Goal Setting: - Group verbal and written instruction through game format to discuss heart disease and the return to sexual intimacy. Provides group verbal and written material to discuss and apply goal setting through the application of the S.M.A.R.T. Method.   Other Matters of the Heart: - Provides group verbal, written materials and models to describe Heart Failure, Angina, Valve Disease, and Diabetes in the realm of heart disease. Includes description of the disease process and treatment options available to the cardiac patient.   Exercise & Equipment Safety: - Individual verbal instruction and demonstration of equipment use and safety with use of the equipment.      Cardiac Rehab from 10/02/2014 in [facility] Cardiac Rehab   Date  10/02/14 [Discussed exer equipm can use at [employer] ours]   Educator  C. Enterkin,RN   Instruction Review Code  1- partially meets, needs review/practice      Infection Prevention: - Provides verbal and written material to individual with discussion of infection control  including proper hand washing and proper equipment cleaning during exercise session.      Cardiac Rehab from 10/02/2014 in [facility] Cardiac Rehab   Date  09/23/14   Educator  C. Enterkin,RN   Instruction Review Code  2- meets goals/outcomes      Falls Prevention: - Provides verbal and written material to individual with discussion of falls prevention and safety.      Cardiac Rehab from 10/02/2014 in [facility] Cardiac Rehab   Date  09/23/14   Educator  C. Enterkin,RN   Instruction Review Code  1- partially meets, needs review/practice      Diabetes: - Individual verbal and written instruction to review signs/symptoms of diabetes, desired ranges of glucose level fasting, after meals and with exercise. Advice that pre and post exercise glucose checks will be done for 3 sessions at entry of program.    Knowledge Questionnaire Score:     Knowledge Questionnaire Score - 09/23/14 1025    Knowledge Questionnaire Score   Pre Score --  Not done-due to [name] having dementia      Personal Goals and Risk Factors at Admission:     Personal Goals and Risk Factors at Admission - 09/23/14 1027  Personal Goals and Risk Factors on Admission    Weight Management Yes   Intervention Learn and follow the exercise and diet guidelines while in the program. Utilize the nutrition and education classes to help gain knowledge of the diet and exercise expectations in the program  Needs to gain weight   Increase Aerobic Exercise and Physical Activity Yes   Hypertension Yes   Goal Participant will see blood pressure controlled within the values of 140/42mm/Hg or within value directed by their physician.   Intervention Provide nutrition & aerobic exercise along with prescribed medications to achieve BP 140/90 or less.   Lipids Yes   Goal Cholesterol controlled with medications as prescribed, with individualized exercise RX and with personalized nutrition plan. Value goals: LDL < 70mg , HDL > 40mg . Participant  states understanding of desired cholesterol values and following prescriptions.   Intervention Provide nutrition & aerobic exercise along with prescribed medications to achieve LDL 70mg , HDL >40mg .      Personal Goals and Risk Factors Review:      Goals and Risk Factor Review      09/25/14 1217 10/02/14 1023 10/02/14 1025       Increase Aerobic Exercise and Physical Activity   Goals Progress/Improvement seen  Yes Yes      Comments Quamere has dementia so exercise specialist spent a great deal of time with him explaining and setting him up on the Nustep. Larry was able to exercise on the Nustep plus on the REL XR6000. Kjuan liked the XR6000.  Kacin said "thanks for helping me, you all have helped me a lot". --  Cougar will got to [employer] with his daughter when graduates from Cardiac Rehab.      Hypertension   Goal  Participant will see blood pressure controlled within the values of 140/44mm/Hg or within value directed by their physician.  Blood pressure today was 112/62 very good.      Abnormal Lipids   Goal  Cholesterol controlled with medications as prescribed, with individualized exercise RX and with personalized nutrition plan. Value goals: LDL < 70mg , HDL > 40mg . Participant states understanding of desired cholesterol values and following prescriptions.  Taking cholestrol medicine.         Personal Goals Discharge (Final Personal Goals and Risk Factors Review):      Goals and Risk Factor Review - 10/02/14 1025    Increase Aerobic Exercise and Physical Activity   Comments --  Jarriel will got to [employer] with his daughter when graduates from Cardiac Rehab.        Comments: Offered Catering manager fit info to patient and his daughter but she felt it would be less stressful for them and plans on both of them exercising at [employer] after he graduates from Cardiac Rehab.

## 2014-10-02 NOTE — Progress Notes (Signed)
Cardiac Individual Treatment Plan  Patient Details  Name: Travis BIEGEL Sr. MRN: [id number] Date of Birth: July 10, 1931 Referring Provider:  Lottie Dawson, MD  Initial Encounter Date:    Visit Diagnosis: S/P CABG x 1  Patient's Home Medications on Admission:  Current outpatient prescriptions:  .  atorvastatin (LIPITOR) 80 MG tablet, Take by mouth., Disp: , Rfl:  .  donepezil (ARICEPT) 5 MG tablet, Take by mouth., Disp: , Rfl:  .  metoprolol tartrate (LOPRESSOR) 25 MG tablet, Take by mouth., Disp: , Rfl:  .  nicotine (RA NICOTINE) 21 mg/24hr patch, Place onto the skin., Disp: , Rfl:  .  nitroGLYCERIN (NITROSTAT) 0.4 MG SL tablet, Place under the tongue., Disp: , Rfl:  .  tamsulosin (FLOMAX) 0.4 MG CAPS capsule, Take by mouth., Disp: , Rfl:   Past Medical History: No past medical history on file.  Tobacco Use: History  Smoking status  . Former Smoker -- 1.50 packs/day for 60 years  . Types: Cigarettes  . Quit date: 11/03/2013  Smokeless tobacco  . Not on file    Labs: Recent Review Flowsheet Data    There is no flowsheet data to display.       Exercise Target Goals:    Exercise Program Goal: Individual exercise prescription set with THRR, safety & activity barriers. Participant demonstrates ability to understand and report RPE using BORG scale, to self-measure pulse accurately, and to acknowledge the importance of the exercise prescription.  Exercise Prescription Goal: Starting with aerobic activity 30 plus minutes a day, 3 days per week for initial exercise prescription. Provide home exercise prescription and guidelines that participant acknowledges understanding prior to discharge.  Activity Barriers & Risk Stratification:     Activity Barriers & Risk Stratification - 09/23/14 1023    Activity Barriers & Risk Stratification   Activity Barriers Other (comment)  Travis Webb has dementia but does follow instructions and was able to do 6 minute walk test.    Risk  Stratification High      6 Minute Walk:     6 Minute Walk      09/23/14 0917       6 Minute Walk   Phase Initial     Distance 1140 feet     Walk Time 6 minutes     Resting HR 70 bpm     Resting BP 130/70 mmHg     Max Ex. HR 109 bpm     Max Ex. BP 132/68 mmHg     RPE 15     Symptoms No        Initial Exercise Prescription:     Initial Exercise Prescription - 09/23/14 1100    Date of Initial Exercise Prescription   Date 09/23/14   Treadmill   MPH 1.8   Grade 0   Minutes 10   Bike   Level 0.4   Minutes 10   Recumbant Bike   Level 2   RPM 40   Watts 20   Minutes 15   NuStep   Level 2   Watts 30   Minutes 15   Arm Ergometer   Level 1   Watts 8   Minutes 10   Arm/Foot Ergometer   Level 4   Watts 15   Minutes 10   Cybex   Level 3   RPM 60   Minutes 10   Recumbant Elliptical   Level 1   RPM 40   Watts 10   Minutes 10   REL-XR  Level 1   Watts 30   Minutes 15   Prescription Details   Frequency (times per week) 3   Duration Progress to 30 minutes of continuous aerobic without signs/symptoms of physical distress   Intensity   THRR REST +  30   Ratings of Perceived Exertion 11-15   Progression Continue progressive overload as per policy without signs/symptoms or physical distress.   Resistance Training   Training Prescription Yes   Weight 2   Reps 10-15      Exercise Prescription Changes:   Discharge Exercise Prescription (Final Exercise Prescription Changes):   Nutrition:  Target Goals: Understanding of nutrition guidelines, daily intake of sodium 1500mg , cholesterol 200mg , calories 30% from fat and 7% or less from saturated fats, daily to have 5 or more servings of fruits and vegetables.  Biometrics:     Pre Biometrics - 09/23/14 0916    Pre Biometrics   Height 5' 8.6" (1.742 m)   Weight 144 lb (65.318 kg)   Waist Circumference 33.75 inches   Hip Circumference 35 inches   Waist to Hip Ratio 0.96 %   BMI (Calculated) 21.6        Nutrition Therapy Plan and Nutrition Goals:     Nutrition Therapy & Goals - 09/23/14 1026    Nutrition Therapy   Diet --  Travis Webb has dementia but his Daughter Travis Webb is intrested in sodium info etc.   Drug/Food Interactions Statins/Certain Fruits   Intervention Plan   Intervention Using nutrition plan and personal goals to gain a healthy nutrition lifestyle. Add exercise as prescribed.      Nutrition Discharge: Rate Your Plate Scores:   Nutrition Goals Re-Evaluation:     Nutrition Goals Re-Evaluation      09/25/14 1216           Personal Goal #1 Re-Evaluation   Personal Goal #1 Travis Webb left Travis Webb Travis Webb Travis Webb ) daughter a vm since daughter wants to know more informationa about sodium and meet with the dietician. I mentioned 1500 mg daily sodium intake is recommended. Her father could gain weight actually but has a decreased appetitie compared to when he used to work.        Goal Progress Seen Yes          Psychosocial: Target Goals: Acknowledge presence or absence of depression, maximize coping skills, provide positive support system. Participant is able to verbalize types and ability to use techniques and skills needed for reducing stress and depression.  Initial Review & Psychosocial Screening:     Initial Psych Review & Screening - 09/23/14 1028    Initial Review   Current issues with Current Sleep Concerns  Travis Webb has dementia. His daughter is taking care of him and her Grandchild full time.    Family Dynamics   Good Support System? Yes   Screening Interventions   Interventions Encouraged to exercise;Program counselor consult      Quality of Life Scores:     Quality of Life - 09/23/14 1241    Quality of Life Scores   Health/Function Pre 24.11 %  Travis Webb asked Travis Webb these questions.   Socioeconomic Pre 27.08 %   Psych/Spiritual Pre 23.79 %   Family Pre 27.25 %   GLOBAL Pre 25.02 %      PHQ-9:     Recent Review Flowsheet Data    Depression  screen [facility] 2/9 09/23/2014   Decreased Interest 0   Down, Depressed, Hopeless 1   PHQ - 2 Score 1  Altered sleeping 3   Tired, decreased energy 1   Change in appetite 1   Feeling bad or failure about yourself  0   Trouble concentrating 1   Moving slowly or fidgety/restless 1   Suicidal thoughts 0   PHQ-9 Score 8   Difficult doing work/chores Not difficult at all      Psychosocial Evaluation and Intervention:   Psychosocial Re-Evaluation:   Vocational Rehabilitation: Provide vocational rehab assistance to qualifying candidates.   Vocational Rehab Evaluation & Intervention:     Vocational Rehab - 09/23/14 1025    Initial Vocational Rehab Evaluation & Intervention   Assessment shows need for Vocational Rehabilitation No      Education: Education Goals: Education classes will be provided on a weekly basis, covering required topics. Participant will state understanding/return demonstration of topics presented.  Learning Barriers/Preferences:     Learning Barriers/Preferences - 09/23/14 1024    Learning Barriers/Preferences   Learning Barriers Reading;Hearing   Learning Preferences None      Education Topics: General Nutrition Guidelines/Fats and Fiber: -Group instruction provided by verbal, written material, models and posters to present the general guidelines for heart healthy nutrition. Gives an explanation and review of dietary fats and fiber.   Controlling Sodium/Reading Food Labels: -Group verbal and written material supporting the discussion of sodium use in heart healthy nutrition. Review and explanation with models, verbal and written materials for utilization of the food label.   Exercise Physiology & Risk Factors: - Group verbal and written instruction with models to review the exercise physiology of the cardiovascular system and associated critical values. Details cardiovascular disease risk factors and the goals associated with each risk  factor.   Aerobic Exercise & Resistance Training: - Gives group verbal and written discussion on the health impact of inactivity. On the components of aerobic and resistive training programs and the benefits of this training and how to safely progress through these programs.   Flexibility, Balance, General Exercise Guidelines: - Provides group verbal and written instruction on the benefits of flexibility and balance training programs. Provides general exercise guidelines with specific guidelines to those with heart or lung disease. Demonstration and skill practice provided.   Stress Management: - Provides group verbal and written instruction about the health risks of elevated stress, cause of high stress, and healthy ways to reduce stress.          Cardiac Rehab from 09/30/2014 in [facility]   Date  09/25/14   Educator  C. Enterkin, RN   Instruction Review Code  2- meets goals/outcomes      Depression: - Provides group verbal and written instruction on the correlation between heart/lung disease and depressed mood, treatment options, and the stigmas associated with seeking treatment.   Anatomy & Physiology of the Heart: - Group verbal and written instruction and models provide basic cardiac anatomy and physiology, with the coronary electrical and arterial systems. Review of: AMI, Angina, Valve disease, Heart Failure, Cardiac Arrhythmia, Pacemakers, and the ICD.      Cardiac Rehab from 09/30/2014 in [facility]   Date  09/30/14   Educator  DW   Instruction Review Code  2- meets goals/outcomes      Cardiac Procedures: - Group verbal and written instruction and models to describe the testing methods done to diagnose heart disease. Reviews the outcomes of the test results. Describes the treatment choices: Medical Management, Angioplasty, or Coronary Bypass Surgery.   Cardiac Medications: - Group verbal and written instruction to review  commonly prescribed medications  for heart disease. Reviews the medication, class of the drug, and side effects. Includes the steps to properly store meds and maintain the prescription regimen.   Go Sex-Intimacy & Heart Disease, Get SMART - Goal Setting: - Group verbal and written instruction through game format to discuss heart disease and the return to sexual intimacy. Provides group verbal and written material to discuss and apply goal setting through the application of the S.M.A.R.T. Method.   Other Matters of the Heart: - Provides group verbal, written materials and models to describe Heart Failure, Angina, Valve Disease, and Diabetes in the realm of heart disease. Includes description of the disease process and treatment options available to the cardiac patient.   Exercise & Equipment Safety: - Individual verbal instruction and demonstration of equipment use and safety with use of the equipment.      Cardiac Rehab from 09/30/2014 in [facility]   Date  09/23/14   Educator  C. Enterkin,RN   Instruction Review Code  1- partially meets, needs review/practice      Infection Prevention: - Provides verbal and written material to individual with discussion of infection control including proper hand washing and proper equipment cleaning during exercise session.      Cardiac Rehab from 09/30/2014 in [facility]   Date  09/23/14   Educator  C. Enterkin,RN   Instruction Review Code  2- meets goals/outcomes      Falls Prevention: - Provides verbal and written material to individual with discussion of falls prevention and safety.      Cardiac Rehab from 09/30/2014 in [facility]   Date  09/23/14   Educator  C. Enterkin,RN   Instruction Review Code  1- partially meets, needs review/practice      Diabetes: - Individual verbal and written instruction to review signs/symptoms of diabetes, desired ranges of glucose level fasting, after meals and with exercise. Advice that pre and post exercise glucose  checks will be done for 3 sessions at entry of program.    Knowledge Questionnaire Score:     Knowledge Questionnaire Score - 09/23/14 1025    Knowledge Questionnaire Score   Pre Score --  Not done-due to Travis Webb having dementia      Personal Goals and Risk Factors at Admission:     Personal Goals and Risk Factors at Admission - 09/23/14 1027    Personal Goals and Risk Factors on Admission    Weight Management Yes   Intervention Learn and follow the exercise and diet guidelines while in the program. Utilize the nutrition and education classes to help gain knowledge of the diet and exercise expectations in the program  Needs to gain weight   Increase Aerobic Exercise and Physical Activity Yes   Hypertension Yes   Goal Participant will see blood pressure controlled within the values of 140/8mm/Hg or within value directed by their physician.   Intervention Provide nutrition & aerobic exercise along with prescribed medications to achieve BP 140/90 or less.   Lipids Yes   Goal Cholesterol controlled with medications as prescribed, with individualized exercise RX and with personalized nutrition plan. Value goals: LDL < 70mg , HDL > 40mg . Participant states understanding of desired cholesterol values and following prescriptions.   Intervention Provide nutrition & aerobic exercise along with prescribed medications to achieve LDL 70mg , HDL >40mg .      Personal Goals and Risk Factors Review:      Goals and Risk Factor Review      09/25/14  1217           Increase Aerobic Exercise and Physical Activity   Goals Progress/Improvement seen  Yes       Comments Travis Webb has dementia so exercise specialist spent a great deal of time with him explaining and setting him up on the Nustep. Travis Webb was able to exercise on the Nustep plus on the REL XR6000. Travis Webb liked the XR6000.           Personal Goals Discharge (Final Personal Goals and Risk Factors Review):      Goals and Risk Factor Review -  09/25/14 1217    Increase Aerobic Exercise and Physical Activity   Goals Progress/Improvement seen  Yes   Comments Travis Webb has dementia so exercise specialist spent a great deal of time with him explaining and setting him up on the Nustep. Travis Webb was able to exercise on the Nustep plus on the REL XR6000. Travis Webb liked the XR6000.        Comments: Patient has dementia and seems to do better with exercising if it is intervals. We give him a goal of 10 minutes on each piece of equipment and once he completes the 10 minutes, he takes a small 2-5 minute break and then restarts again. He is able to get his full time in exercising in intervals.

## 2014-10-07 ENCOUNTER — Encounter: Payer: Medicare Other | Attending: Cardiothoracic Surgery

## 2014-10-07 DIAGNOSIS — Z951 Presence of aortocoronary bypass graft: Secondary | ICD-10-CM | POA: Diagnosis present

## 2014-10-07 NOTE — Progress Notes (Signed)
Daily Session Note  Patient Details  Name: Ramez G Hodapp Sr. MRN: [id number] Date of Birth: 01/31/1931 Referring Provider:  Caranasos, Thomas G, MD  Encounter Date: 10/07/2014  Check In:     Session Check In - 10/07/14 0855    Check-In   Staff Present Renee MacMillan MS, ACSM CEP Exercise Physiologist;Other;Diane Wright RN, BSN   ER physicians immediately available to respond to emergencies See telemetry face sheet for immediately available ER MD   Medication changes reported     No   Fall or balance concerns reported    No   Warm-up and Cool-down Performed on first and last piece of equipment   VAD Patient? No   Pain Assessment   Currently in Pain? No/denies           Exercise Prescription Changes - 10/07/14 0800    NuStep   Level 3   Watts 50   Minutes 10  Does well with 10min, rest then 5 minutes      Goals Met:  Independence with exercise equipment Exercise tolerated well No report of cardiac concerns or symptoms Strength training completed today  Goals Unmet:  Not Applicable  Goals Comments: Jaymz is doing well in the program, especially with his 10 minute intervals on equipment. I have increased him to level 3/50 watts on the NuStep.   Dr. Mark Miller is Medical Director for [facility] and [facility]. 

## 2014-10-09 DIAGNOSIS — Z951 Presence of aortocoronary bypass graft: Secondary | ICD-10-CM

## 2014-10-09 NOTE — Progress Notes (Addendum)
Cardiac Individual Treatment Plan  Patient Details  Name: Travis ReekHarry G Demchak Sr. MRN: [id number] Date of Birth: [birth year] Referring Provider:  Lottie Webb, Thomas G, MD  Initial Encounter Date:    Visit Diagnosis: S/P CABG x 1  Patient's Home Medications on Admission:  Current outpatient prescriptions:  .  atorvastatin (LIPITOR) 80 MG tablet, Take by mouth., Disp: , Rfl:  .  donepezil (ARICEPT) 5 MG tablet, Take by mouth., Disp: , Rfl:  .  metoprolol tartrate (LOPRESSOR) 25 MG tablet, Take by mouth., Disp: , Rfl:  .  nicotine (RA NICOTINE) 21 mg/24hr patch, Place onto the skin., Disp: , Rfl:  .  nitroGLYCERIN (NITROSTAT) 0.4 MG SL tablet, Place under the tongue., Disp: , Rfl:  .  tamsulosin (FLOMAX) 0.4 MG CAPS capsule, Take by mouth., Disp: , Rfl:   Past Medical History: No past medical history on file.  Tobacco Use: History  Smoking status  . Former Smoker -- 1.50 packs/day for 60 years  . Types: Cigarettes  . Quit date: 11/03/2013  Smokeless tobacco  . Not on file    Labs: Recent Review Flowsheet Data    There is no flowsheet data to display.       Exercise Target Goals:    Exercise Program Goal: Individual exercise prescription set with THRR, safety & activity barriers. Participant demonstrates ability to understand and report RPE using BORG scale, to self-measure pulse accurately, and to acknowledge the importance of the exercise prescription.  Exercise Prescription Goal: Starting with aerobic activity 30 plus minutes a day, 3 days per week for initial exercise prescription. Provide home exercise prescription and guidelines that participant acknowledges understanding prior to discharge.  Activity Barriers & Risk Stratification:     Activity Barriers & Risk Stratification - 09/23/14 1023    Activity Barriers & Risk Stratification   Activity Barriers Other (comment)  Lollie SailsHarry has dementia but does follow instructions and was able to do 6 minute walk test.    Risk  Stratification High      6 Minute Walk:     6 Minute Walk      09/23/14 0917       6 Minute Walk   Phase Initial     Distance 1140 feet     Walk Time 6 minutes     Resting HR 70 bpm     Resting BP 130/70 mmHg     Max Ex. HR 109 bpm     Max Ex. BP 132/68 mmHg     RPE 15     Symptoms No        Initial Exercise Prescription:     Initial Exercise Prescription - 10/02/14 1000    Date of Initial Exercise Prescription   Date 09/23/14   Treadmill   MPH 1.8   Grade 0   Minutes 10   Bike   Level --   Minutes --   Recumbant Bike   Level --   RPM --   Watts --   Minutes --   NuStep   Level 2   Watts 30   Minutes 10  Does better with 10min then 5 minutes   Arm Ergometer   Level 1   Watts 8   Minutes 10   Arm/Foot Ergometer   Level --   Watts --   Minutes --   Cybex   Level 3   RPM 60   Minutes 10   Recumbant Elliptical   Level --   RPM --   Watts --  Minutes --   REL-XR   Level 1   Watts 30   Minutes 15   Prescription Details   Frequency (times per week) 3   Intensity   THRR REST +  30   Ratings of Perceived Exertion 11-15   Progression Continue progressive overload as per policy without signs/symptoms or physical distress.   Resistance Training   Training Prescription Yes   Weight 2   Reps 10-15      Exercise Prescription Changes:     Exercise Prescription Changes      10/02/14 1000 10/02/14 1022 10/07/14 0800       NuStep   Level Watts 45 45 50     Minutes 10  Does well with , rest then 5 minutes 10  Does well with , rest then 5 minutes 10  Does well with , rest then 5 minutes        Discharge Exercise Prescription (Final Exercise Prescription Changes):     Exercise Prescription Changes - 10/07/14 0800    NuStep   Level 3   Watts 50   Minutes 10  Does well with , rest then 5 minutes      Nutrition:  Target Goals: Understanding of nutrition guidelines, daily intake of sodium 1500mg ,  cholesterol 200mg , calories 30% from fat and 7% or less from saturated fats, daily to have 5 or more servings of fruits and vegetables.  Biometrics:     Pre Biometrics - 09/23/14 0916    Pre Biometrics   Height 5' 8.6" (1.742 m)   Weight 144 lb (65.318 kg)   Waist Circumference 33.75 inches   Hip Circumference 35 inches   Waist to Hip Ratio 0.96 %   BMI (Calculated) 21.6       Nutrition Therapy Plan and Nutrition Goals:     Nutrition Therapy & Goals - 09/23/14 1026    Nutrition Therapy   Diet --  [name] has dementia but his Daughter Travis Webb is intrested in sodium info etc.   Drug/Food Interactions Statins/Certain Fruits   Intervention Plan   Intervention Using nutrition plan and personal goals to gain a healthy nutrition lifestyle. Add exercise as prescribed.      Nutrition Discharge: Rate Your Plate Scores:   Nutrition Goals Re-Evaluation:     Nutrition Goals Re-Evaluation      09/25/14 1216 10/02/14 1022         Personal Goal #1 Re-Evaluation   Personal Goal #1 Pam left Travis Webb [name] ) daughter a vm since daughter wants to know more informationa about sodium and meet with the dietician. I mentioned 1500 mg daily sodium intake is recommended. Her father could gain weight actually but has a decreased appetitie compared to when he used to work.        Goal Progress Seen Yes       Comments  Pam has left daughter a vm. I will give her information about sodium.          Psychosocial: Target Goals: Acknowledge presence or absence of depression, maximize coping skills, provide positive support system. Participant is able to verbalize types and ability to use techniques and skills needed for reducing stress and depression.  Initial Review & Psychosocial Screening:     Initial Psych Review & Screening - 09/23/14 1028    Initial Review   Current issues with Current Sleep Concerns  Lucero has dementia. His daughter is taking care of him and her Grandchild  full time.     Family Dynamics   Good Support System? Yes   Screening Interventions   Interventions Encouraged to exercise;Program counselor consult      Quality of Life Scores:     Quality of Life - 09/23/14 1241    Quality of Life Scores   Health/Function Pre 24.11 %  Duaghter asked Tristen Luce these questions.   Socioeconomic Pre 27.08 %   Psych/Spiritual Pre 23.79 %   Family Pre 27.25 %   GLOBAL Pre 25.02 %      PHQ-9:     Recent Review Flowsheet Data    Depression screen [facility] 2/9 09/23/2014   Decreased Interest 0   Down, Depressed, Hopeless 1   PHQ - 2 Score 1   Altered sleeping 3   Tired, decreased energy 1   Change in appetite 1   Feeling bad or failure about yourself  0   Trouble concentrating 1   Moving slowly or fidgety/restless 1   Suicidal thoughts 0   PHQ-9 Score 8   Difficult doing work/chores Not difficult at all      Psychosocial Evaluation and Intervention:   Psychosocial Re-Evaluation:     Psychosocial Re-Evaluation      10/02/14 1024           Psychosocial Re-Evaluation   Interventions Encouraged to attend Cardiac Rehabilitation for the exercise       Comments --  Daughter given info about Adult Day Care programs          Vocational Rehabilitation: Provide vocational rehab assistance to qualifying candidates.   Vocational Rehab Evaluation & Intervention:     Vocational Rehab - 09/23/14 1025    Initial Vocational Rehab Evaluation & Intervention   Assessment shows need for Vocational Rehabilitation No      Education: Education Goals: Education classes will be provided on a weekly basis, covering required topics. Participant will state understanding/return demonstration of topics presented.  Learning Barriers/Preferences:     Learning Barriers/Preferences - 09/23/14 1024    Learning Barriers/Preferences   Learning Barriers Reading;Hearing   Learning Preferences None      Education Topics: General Nutrition Guidelines/Fats and  Fiber: -Group instruction provided by verbal, written material, models and posters to present the general guidelines for heart healthy nutrition. Gives an explanation and review of dietary fats and fiber.   Controlling Sodium/Reading Food Labels: -Group verbal and written material supporting the discussion of sodium use in heart healthy nutrition. Review and explanation with models, verbal and written materials for utilization of the food label.   Exercise Physiology & Risk Factors: - Group verbal and written instruction with models to review the exercise physiology of the cardiovascular system and associated critical values. Details cardiovascular disease risk factors and the goals associated with each risk factor.   Aerobic Exercise & Resistance Training: - Gives group verbal and written discussion on the health impact of inactivity. On the components of aerobic and resistive training programs and the benefits of this training and how to safely progress through these programs.   Flexibility, Balance, General Exercise Guidelines: - Provides group verbal and written instruction on the benefits of flexibility and balance training programs. Provides general exercise guidelines with specific guidelines to those with heart or lung disease. Demonstration and skill practice provided.   Stress Management: - Provides group verbal and written instruction about the health risks of elevated stress, cause of high stress, and healthy ways to reduce stress.  Cardiac Rehab from 10/09/2014 in [facility] Cardiac Rehab   Date  09/25/14   Educator  C. Makai Agostinelli, RN   Instruction Review Code  2- meets goals/outcomes      Depression: - Provides group verbal and written instruction on the correlation between heart/lung disease and depressed mood, treatment options, and the stigmas associated with seeking treatment.   Anatomy & Physiology of the Heart: - Group verbal and written instruction and models  provide basic cardiac anatomy and physiology, with the coronary electrical and arterial systems. Review of: AMI, Angina, Valve disease, Heart Failure, Cardiac Arrhythmia, Pacemakers, and the ICD.      Cardiac Rehab from 10/09/2014 in [facility] Cardiac Rehab   Date  09/30/14   Educator  DW   Instruction Review Code  2- meets goals/outcomes      Cardiac Procedures: - Group verbal and written instruction and models to describe the testing methods done to diagnose heart disease. Reviews the outcomes of the test results. Describes the treatment choices: Medical Management, Angioplasty, or Coronary Bypass Surgery.   Cardiac Medications: - Group verbal and written instruction to review commonly prescribed medications for heart disease. Reviews the medication, class of the drug, and side effects. Includes the steps to properly store meds and maintain the prescription regimen.   Go Sex-Intimacy & Heart Disease, Get SMART - Goal Setting: - Group verbal and written instruction through game format to discuss heart disease and the return to sexual intimacy. Provides group verbal and written material to discuss and apply goal setting through the application of the S.M.A.R.T. Method.   Other Matters of the Heart: - Provides group verbal, written materials and models to describe Heart Failure, Angina, Valve Disease, and Diabetes in the realm of heart disease. Includes description of the disease process and treatment options available to the cardiac patient.      Cardiac Rehab from 10/09/2014 in [facility] Cardiac Rehab   Date  10/09/14   Educator  C. Ayyan Sites, RN   Instruction Review Code  2- meets goals/outcomes      Exercise & Equipment Safety: - Individual verbal instruction and demonstration of equipment use and safety with use of the equipment.      Cardiac Rehab from 10/09/2014 in [facility] Cardiac Rehab   Date  10/02/14 [Discussed exer equipm can use at [employer] ours]   Educator  C. Florian Chauca,RN    Instruction Review Code  1- partially meets, needs review/practice      Infection Prevention: - Provides verbal and written material to individual with discussion of infection control including proper hand washing and proper equipment cleaning during exercise session.      Cardiac Rehab from 10/09/2014 in [facility] Cardiac Rehab   Date  09/23/14   Educator  C. Aryonna Gunnerson,RN   Instruction Review Code  2- meets goals/outcomes      Falls Prevention: - Provides verbal and written material to individual with discussion of falls prevention and safety.      Cardiac Rehab from 10/09/2014 in [facility] Cardiac Rehab   Date  09/23/14   Educator  C. Hershel Corkery,RN   Instruction Review Code  1- partially meets, needs review/practice      Diabetes: - Individual verbal and written instruction to review signs/symptoms of diabetes, desired ranges of glucose level fasting, after meals and with exercise. Advice that pre and post exercise glucose checks will be done for 3 sessions at entry of program.    Knowledge Questionnaire Score:     Knowledge Questionnaire Score - 09/23/14 1025  Knowledge Questionnaire Score   Pre Score --  Not done-due to [name] having dementia      Personal Goals and Risk Factors at Admission:     Personal Goals and Risk Factors at Admission - 09/23/14 1027    Personal Goals and Risk Factors on Admission    Weight Management Yes   Intervention Learn and follow the exercise and diet guidelines while in the program. Utilize the nutrition and education classes to help gain knowledge of the diet and exercise expectations in the program  Needs to gain weight   Increase Aerobic Exercise and Physical Activity Yes   Hypertension Yes   Goal Participant will see blood pressure controlled within the values of 140/38mm/Hg or within value directed by their physician.   Intervention Provide nutrition & aerobic exercise along with prescribed medications to achieve BP 140/90 or less.   Lipids  Yes   Goal Cholesterol controlled with medications as prescribed, with individualized exercise RX and with personalized nutrition plan. Value goals: LDL < 70mg , HDL > 40mg . Participant states understanding of desired cholesterol values and following prescriptions.   Intervention Provide nutrition & aerobic exercise along with prescribed medications to achieve LDL 70mg , HDL >40mg .      Personal Goals and Risk Factors Review:      Goals and Risk Factor Review      09/25/14 1217 10/02/14 1023 10/02/14 1025       Increase Aerobic Exercise and Physical Activity   Goals Progress/Improvement seen  Yes Yes      Comments Rushton has dementia so exercise specialist spent a great deal of time with him explaining and setting him up on the Nustep. Cephus was able to exercise on the Nustep plus on the REL XR6000. Clare liked the XR6000.  Rayhan said "thanks for helping me, you all have helped me a lot". --  Iven will got to [employer] with his daughter when graduates from Cardiac Rehab.      Hypertension   Goal  Participant will see blood pressure controlled within the values of 140/48mm/Hg or within value directed by their physician.  Blood pressure today was 112/62 very good.      Abnormal Lipids   Goal  Cholesterol controlled with medications as prescribed, with individualized exercise RX and with personalized nutrition plan. Value goals: LDL < 70mg , HDL > 40mg . Participant states understanding of desired cholesterol values and following prescriptions.  Taking cholestrol medicine.         Personal Goals Discharge (Final Personal Goals and Risk Factors Review):      Goals and Risk Factor Review - 10/02/14 1025    Increase Aerobic Exercise and Physical Activity   Comments --  Laurin will got to [employer] with his daughter when graduates from Cardiac Rehab.        Comments: Doing well in Cardiac REhab but is limited some due to his dementia but we keep encouraging him to exercise. His  daughter picks him up after class. Marland Kitchen

## 2014-10-09 NOTE — Progress Notes (Signed)
Daily Session Note  Patient Details  Name: Travis WHETSTINE Sr. MRN: [id number] Date of Birth: 1931-06-10 Referring Provider:  Gustavo Lah, MD  Encounter Date: 10/09/2014  Check In:     Session Check In - 10/09/14 0901    Check-In   Staff Present Lestine Box BS, ACSM EP-C, Exercise Physiologist;Carroll Enterkin RN, BSN;Other   ER physicians immediately available to respond to emergencies See telemetry face sheet for immediately available ER MD   Medication changes reported     No   Fall or balance concerns reported    No   Warm-up and Cool-down Performed on first and last piece of equipment   VAD Patient? No   Pain Assessment   Currently in Pain? No/denies         Goals Met:  Exercise tolerated well No report of cardiac concerns or symptoms Strength training completed today  Goals Unmet:  Not Applicable  Goals Comments:    Dr. Emily Filbert is Medical Director for [facility] and [facility].

## 2014-10-14 DIAGNOSIS — Z951 Presence of aortocoronary bypass graft: Secondary | ICD-10-CM | POA: Diagnosis not present

## 2014-10-14 NOTE — Progress Notes (Signed)
Daily Session Note  Patient Details  Name: Travis WEMPE Sr. MRN: [id number] Date of Birth: [birth year] Referring Provider:  Gustavo Lah, MD  Encounter Date: 10/14/2014  Check In:     Session Check In - 10/14/14 0848    Check-In   Staff Present Diane Joya Gaskins RN, BSN;Other;Renee Dillard Essex MS, ACSM CEP Exercise Physiologist   Medication changes reported     No   Fall or balance concerns reported    No   Warm-up and Cool-down Performed on first and last piece of equipment   VAD Patient? No   Pain Assessment   Currently in Pain? No/denies           Exercise Prescription Changes - 10/14/14 0800    Exercise Review   Progression Yes   Response to Exercise   Blood Pressure (Admit) 120/80 mmHg   Blood Pressure (Exercise) 128/80 mmHg   Blood Pressure (Exit) 120/70 mmHg   Heart Rate (Admit) 81 bpm   Heart Rate (Exercise) 98 bpm   Heart Rate (Exit) 69 bpm   Rating of Perceived Exertion (Exercise) 13   Symptoms No   Duration Progress to 50 minutes of aerobic without signs/symptoms of physical distress   Intensity Rest + 30   Progression Continue progressive overload as per policy without signs/symptoms or physical distress.   Resistance Training   Training Prescription Yes   Weight 2   Reps 10-15   Interval Training   Interval Training No   NuStep   Level 4   Watts 50   Minutes 20   Arm Ergometer   Level 1   Watts 10   Minutes 10      Goals Met:  Exercise tolerated well No report of cardiac concerns or symptoms Strength training completed today  Goals Unmet:  Not Applicable  Goals Comments: [name] is doing well with his 10 minute intervals of exercise. I increased him to level 4 on the NuStep and he is doing well with that also.    Dr. Emily Filbert is Medical Director for [facility] and [facility].

## 2014-10-15 NOTE — Progress Notes (Signed)
Cardiac Individual Treatment Plan  Patient Details  Name: Travis ReekHarry G Demchak Sr. MRN: [id number] Date of Birth: [birth year] Referring Provider:  Lottie Dawsonaranasos, Thomas G, MD  Initial Encounter Date:    Visit Diagnosis: S/P CABG x 1  Patient's Home Medications on Admission:  Current outpatient prescriptions:  .  atorvastatin (LIPITOR) 80 MG tablet, Take by mouth., Disp: , Rfl:  .  donepezil (ARICEPT) 5 MG tablet, Take by mouth., Disp: , Rfl:  .  metoprolol tartrate (LOPRESSOR) 25 MG tablet, Take by mouth., Disp: , Rfl:  .  nicotine (RA NICOTINE) 21 mg/24hr patch, Place onto the skin., Disp: , Rfl:  .  nitroGLYCERIN (NITROSTAT) 0.4 MG SL tablet, Place under the tongue., Disp: , Rfl:  .  tamsulosin (FLOMAX) 0.4 MG CAPS capsule, Take by mouth., Disp: , Rfl:   Past Medical History: No past medical history on file.  Tobacco Use: History  Smoking status  . Former Smoker -- 1.50 packs/day for 60 years  . Types: Cigarettes  . Quit date: 11/03/2013  Smokeless tobacco  . Not on file    Labs: Recent Review Flowsheet Data    There is no flowsheet data to display.       Exercise Target Goals:    Exercise Program Goal: Individual exercise prescription set with THRR, safety & activity barriers. Participant demonstrates ability to understand and report RPE using BORG scale, to self-measure pulse accurately, and to acknowledge the importance of the exercise prescription.  Exercise Prescription Goal: Starting with aerobic activity 30 plus minutes a day, 3 days per week for initial exercise prescription. Provide home exercise prescription and guidelines that participant acknowledges understanding prior to discharge.  Activity Barriers & Risk Stratification:     Activity Barriers & Risk Stratification - 09/23/14 1023    Activity Barriers & Risk Stratification   Activity Barriers Other (comment)  Travis Webb has dementia but does follow instructions and was able to do 6 minute walk test.    Risk  Stratification High      6 Minute Walk:     6 Minute Walk      09/23/14 0917       6 Minute Walk   Phase Initial     Distance 1140 feet     Walk Time 6 minutes     Resting HR 70 bpm     Resting BP 130/70 mmHg     Max Ex. HR 109 bpm     Max Ex. BP 132/68 mmHg     RPE 15     Symptoms No        Initial Exercise Prescription:     Initial Exercise Prescription - 10/02/14 1000    Date of Initial Exercise Prescription   Date 09/23/14   Treadmill   MPH 1.8   Grade 0   Minutes 10   Bike   Level --   Minutes --   Recumbant Bike   Level --   RPM --   Watts --   Minutes --   NuStep   Level 2   Watts 30   Minutes 10  Does better with 10min then 5 minutes   Arm Ergometer   Level 1   Watts 8   Minutes 10   Arm/Foot Ergometer   Level --   Watts --   Minutes --   Cybex   Level 3   RPM 60   Minutes 10   Recumbant Elliptical   Level --   RPM --   Watts --  Minutes --   REL-XR   Level 1   Watts 30   Minutes 15   Prescription Details   Frequency (times per week) 3   Intensity   THRR REST +  30   Ratings of Perceived Exertion 11-15   Progression Continue progressive overload as per policy without signs/symptoms or physical distress.   Resistance Training   Training Prescription Yes   Weight 2   Reps 10-15      Exercise Prescription Changes:     Exercise Prescription Changes      10/02/14 1000 10/02/14 1022 10/07/14 0800 10/13/14 1300 10/14/14 0800   Exercise Review   Progression    Yes Yes   Response to Exercise   Blood Pressure (Admit)    120/80 mmHg 120/80 mmHg   Blood Pressure (Exercise)    128/80 mmHg 128/80 mmHg   Blood Pressure (Exit)    120/70 mmHg 120/70 mmHg   Heart Rate (Admit)    81 bpm 81 bpm   Heart Rate (Exercise)    98 bpm 98 bpm   Heart Rate (Exit)    69 bpm 69 bpm   Rating of Perceived Exertion (Exercise)    13 13   Symptoms    No No   Duration    Progress to 50 minutes of aerobic without signs/symptoms of physical distress  Progress to 50 minutes of aerobic without signs/symptoms of physical distress   Intensity    Rest + 30 Rest + 30   Progression    Continue progressive overload as per policy without signs/symptoms or physical distress. Continue progressive overload as per policy without signs/symptoms or physical distress.   Resistance Training   Training Prescription    Yes Yes   Weight    2 2   Reps    10-15 10-15   Interval Training   Interval Training    No No   NuStep   Level 2 2 3 3 4    Watts 45 45 50 50 50   Minutes 10  Does well with , rest then 5 minutes 10  Does well with , rest then 5 minutes 10  Does well with , rest then 5 minutes 20 20   Arm Ergometer   Level    1 1   Watts    10 10   Minutes    10 10      Discharge Exercise Prescription (Final Exercise Prescription Changes):     Exercise Prescription Changes - 10/14/14 0800    Exercise Review   Progression Yes   Response to Exercise   Blood Pressure (Admit) 120/80 mmHg   Blood Pressure (Exercise) 128/80 mmHg   Blood Pressure (Exit) 120/70 mmHg   Heart Rate (Admit) 81 bpm   Heart Rate (Exercise) 98 bpm   Heart Rate (Exit) 69 bpm   Rating of Perceived Exertion (Exercise) 13   Symptoms No   Duration Progress to 50 minutes of aerobic without signs/symptoms of physical distress   Intensity Rest + 30   Progression Continue progressive overload as per policy without signs/symptoms or physical distress.   Resistance Training   Training Prescription Yes   Weight 2   Reps 10-15   Interval Training   Interval Training No   NuStep   Level 4   Watts 50   Minutes 20   Arm Ergometer   Level 1   Watts 10   Minutes 10      Nutrition:  Target Goals: Understanding of  nutrition guidelines, daily intake of sodium 1500mg , cholesterol 200mg , calories 30% from fat and 7% or less from saturated fats, daily to have 5 or more servings of fruits and vegetables.  Biometrics:     Pre Biometrics - 09/23/14 0916    Pre  Biometrics   Height 5' 8.6" (1.742 m)   Weight 144 lb (65.318 kg)   Waist Circumference 33.75 inches   Hip Circumference 35 inches   Waist to Hip Ratio 0.96 %   BMI (Calculated) 21.6       Nutrition Therapy Plan and Nutrition Goals:     Nutrition Therapy & Goals - 09/23/14 1026    Nutrition Therapy   Diet --  Masson has dementia but his Daughter Travis Land is intrested in sodium info etc.   Drug/Food Interactions Statins/Certain Fruits   Intervention Plan   Intervention Using nutrition plan and personal goals to gain a healthy nutrition lifestyle. Add exercise as prescribed.      Nutrition Discharge: Rate Your Plate Scores:   Nutrition Goals Re-Evaluation:     Nutrition Goals Re-Evaluation      09/25/14 1216 10/02/14 1022         Personal Goal #1 Re-Evaluation   Personal Goal #1 Travis Webb left Travis Land Laurier Webb ) daughter a vm since daughter wants to know more informationa about sodium and meet with the dietician. I mentioned 1500 mg daily sodium intake is recommended. Her father could gain weight actually but has a decreased appetitie compared to when he used to work.        Goal Progress Seen Yes       Comments  Travis Webb has left daughter a vm. I will give her information about sodium.          Psychosocial: Target Goals: Acknowledge presence or absence of depression, maximize coping skills, provide positive support system. Participant is able to verbalize types and ability to use techniques and skills needed for reducing stress and depression.  Initial Review & Psychosocial Screening:     Initial Psych Review & Screening - 09/23/14 1028    Initial Review   Current issues with Current Sleep Concerns  Lonnel has dementia. His daughter is taking care of him and her Grandchild full time.    Family Dynamics   Good Support System? Yes   Screening Interventions   Interventions Encouraged to exercise;Program counselor consult      Quality of Life Scores:     Quality of Life  - 09/23/14 1241    Quality of Life Scores   Health/Function Pre 24.11 %  Duaghter asked Travis Webb these questions.   Socioeconomic Pre 27.08 %   Psych/Spiritual Pre 23.79 %   Family Pre 27.25 %   GLOBAL Pre 25.02 %      PHQ-9:     Recent Review Flowsheet Data    Depression screen [facility] 2/9 09/23/2014   Decreased Interest 0   Down, Depressed, Hopeless 1   PHQ - 2 Score 1   Altered sleeping 3   Tired, decreased energy 1   Change in appetite 1   Feeling bad or failure about yourself  0   Trouble concentrating 1   Moving slowly or fidgety/restless 1   Suicidal thoughts 0   PHQ-9 Score 8   Difficult doing work/chores Not difficult at all      Psychosocial Evaluation and Intervention:   Psychosocial Re-Evaluation:     Psychosocial Re-Evaluation      10/02/14 1024 10/15/14 1346  Psychosocial Re-Evaluation   Interventions Encouraged to attend Cardiac Rehabilitation for the exercise       Comments --  Daughter given info about Adult Day Care programs Dementia so we make sure his daughter picks him up. She is checking into Adult day care for him-given info about [facility].       Continued Psychosocial Services Needed  Yes         Vocational Rehabilitation: Provide vocational rehab assistance to qualifying candidates.   Vocational Rehab Evaluation & Intervention:     Vocational Rehab - 09/23/14 1025    Initial Vocational Rehab Evaluation & Intervention   Assessment shows need for Vocational Rehabilitation No      Education: Education Goals: Education classes will be provided on a weekly basis, covering required topics. Participant will state understanding/return demonstration of topics presented.  Learning Barriers/Preferences:     Learning Barriers/Preferences - 09/23/14 1024    Learning Barriers/Preferences   Learning Barriers Reading;Hearing   Learning Preferences None      Education Topics: General Nutrition Guidelines/Fats and  Fiber: -Group instruction provided by verbal, written material, models and posters to present the general guidelines for heart healthy nutrition. Gives an explanation and review of dietary fats and fiber.          Cardiac Rehab from 10/14/2014 in [facility] Cardiac Rehab   Date  10/14/14   Educator  PI   Instruction Review Code  2- meets goals/outcomes      Controlling Sodium/Reading Food Labels: -Group verbal and written material supporting the discussion of sodium use in heart healthy nutrition. Review and explanation with models, verbal and written materials for utilization of the food label.   Exercise Physiology & Risk Factors: - Group verbal and written instruction with models to review the exercise physiology of the cardiovascular system and associated critical values. Details cardiovascular disease risk factors and the goals associated with each risk factor.   Aerobic Exercise & Resistance Training: - Gives group verbal and written discussion on the health impact of inactivity. On the components of aerobic and resistive training programs and the benefits of this training and how to safely progress through these programs.   Flexibility, Balance, General Exercise Guidelines: - Provides group verbal and written instruction on the benefits of flexibility and balance training programs. Provides general exercise guidelines with specific guidelines to those with heart or lung disease. Demonstration and skill practice provided.   Stress Management: - Provides group verbal and written instruction about the health risks of elevated stress, cause of high stress, and healthy ways to reduce stress.      Cardiac Rehab from 10/14/2014 in [facility] Cardiac Rehab   Date  09/25/14   Educator  C. Shaneen Reeser, RN   Instruction Review Code  2- meets goals/outcomes      Depression: - Provides group verbal and written instruction on the correlation between heart/lung disease and depressed mood, treatment  options, and the stigmas associated with seeking treatment.   Anatomy & Physiology of the Heart: - Group verbal and written instruction and models provide basic cardiac anatomy and physiology, with the coronary electrical and arterial systems. Review of: AMI, Angina, Valve disease, Heart Failure, Cardiac Arrhythmia, Pacemakers, and the ICD.      Cardiac Rehab from 10/14/2014 in [facility] Cardiac Rehab   Date  09/30/14   Educator  DW   Instruction Review Code  2- meets goals/outcomes      Cardiac Procedures: - Group verbal and written instruction and models to  describe the testing methods done to diagnose heart disease. Reviews the outcomes of the test results. Describes the treatment choices: Medical Management, Angioplasty, or Coronary Bypass Surgery.   Cardiac Medications: - Group verbal and written instruction to review commonly prescribed medications for heart disease. Reviews the medication, class of the drug, and side effects. Includes the steps to properly store meds and maintain the prescription regimen.   Go Sex-Intimacy & Heart Disease, Get SMART - Goal Setting: - Group verbal and written instruction through game format to discuss heart disease and the return to sexual intimacy. Provides group verbal and written material to discuss and apply goal setting through the application of the S.M.A.R.T. Method.   Other Matters of the Heart: - Provides group verbal, written materials and models to describe Heart Failure, Angina, Valve Disease, and Diabetes in the realm of heart disease. Includes description of the disease process and treatment options available to the cardiac patient.      Cardiac Rehab from 10/14/2014 in [facility] Cardiac Rehab   Date  10/09/14   Educator  C. Rohnan Bartleson, RN   Instruction Review Code  2- meets goals/outcomes      Exercise & Equipment Safety: - Individual verbal instruction and demonstration of equipment use and safety with use of the equipment.      Cardiac  Rehab from 10/14/2014 in [facility] Cardiac Rehab   Date  10/02/14 [Discussed exer equipm can use at [employer] ours]   Educator  C. Kraig Genis,RN   Instruction Review Code  1- partially meets, needs review/practice      Infection Prevention: - Provides verbal and written material to individual with discussion of infection control including proper hand washing and proper equipment cleaning during exercise session.      Cardiac Rehab from 10/14/2014 in [facility] Cardiac Rehab   Date  09/23/14   Educator  C. Almadelia Looman,RN   Instruction Review Code  2- meets goals/outcomes      Falls Prevention: - Provides verbal and written material to individual with discussion of falls prevention and safety.      Cardiac Rehab from 10/14/2014 in [facility] Cardiac Rehab   Date  09/23/14   Educator  C. Guynell Kleiber,RN   Instruction Review Code  1- partially meets, needs review/practice      Diabetes: - Individual verbal and written instruction to review signs/symptoms of diabetes, desired ranges of glucose level fasting, after meals and with exercise. Advice that pre and post exercise glucose checks will be done for 3 sessions at entry of program.    Knowledge Questionnaire Score:     Knowledge Questionnaire Score - 09/23/14 1025    Knowledge Questionnaire Score   Pre Score --  Not done-due to Travis Sails having dementia      Personal Goals and Risk Factors at Admission:     Personal Goals and Risk Factors at Admission - 09/23/14 1027    Personal Goals and Risk Factors on Admission    Weight Management Yes   Intervention Learn and follow the exercise and diet guidelines while in the program. Utilize the nutrition and education classes to help gain knowledge of the diet and exercise expectations in the program  Needs to gain weight   Increase Aerobic Exercise and Physical Activity Yes   Hypertension Yes   Goal Participant will see blood pressure controlled within the values of 140/87mm/Hg or within value  directed by their physician.   Intervention Provide nutrition & aerobic exercise along with prescribed medications to achieve BP 140/90 or less.  Lipids Yes   Goal Cholesterol controlled with medications as prescribed, with individualized exercise RX and with personalized nutrition plan. Value goals: LDL < 70mg , HDL > 40mg . Participant states understanding of desired cholesterol values and following prescriptions.   Intervention Provide nutrition & aerobic exercise along with prescribed medications to achieve LDL 70mg , HDL >40mg .      Personal Goals and Risk Factors Review:      Goals and Risk Factor Review      09/25/14 1217 10/02/14 1023 10/02/14 1025 10/15/14 1345     Increase Aerobic Exercise and Physical Activity   Goals Progress/Improvement seen  Yes Yes  Yes    Comments Travis Webb has dementia so exercise specialist spent a great deal of time with him explaining and setting him up on the Nustep. Travis Webb was able to exercise on the Nustep plus on the REL XR6000. Travis Webb liked the XR6000.  Travis Webb said "thanks for helping me, you all have helped me a lot". --  Travis Webb will got to [employer] with his daughter when graduates from Cardiac Rehab.  Does well with a lot of encouragement and coaching to complete 10 minute increments of exercise on the Nustep sitting down.     Hypertension   Goal  Participant will see blood pressure controlled within the values of 140/7290mm/Hg or within value directed by their physician.  Blood pressure today was 112/62 very good.  --  Stable blood pressure for Travis Webb.     Abnormal Lipids   Goal  Cholesterol controlled with medications as prescribed, with individualized exercise RX and with personalized nutrition plan. Value goals: LDL < 70mg , HDL > 40mg . Participant states understanding of desired cholesterol values and following prescriptions.  Taking cholestrol medicine.         Personal Goals Discharge (Final Personal Goals and Risk Factors Review):      Goals  and Risk Factor Review - 10/15/14 1345    Increase Aerobic Exercise and Physical Activity   Goals Progress/Improvement seen  Yes   Comments Does well with a lot of encouragement and coaching to complete 10 minute increments of exercise on the Nustep sitting down.    Hypertension   Goal --  Stable blood pressure for Travis Webb.        Comments: Exercise is helping to limit his dementia. He is able to do 10 minute increments.

## 2014-10-15 NOTE — Addendum Note (Signed)
Addended by: Virgina OrganENTERKIN, Adara Kittle on: 10/15/2014 01:48 PM   Modules accepted: Orders

## 2014-10-16 DIAGNOSIS — Z951 Presence of aortocoronary bypass graft: Secondary | ICD-10-CM

## 2014-10-16 NOTE — Progress Notes (Signed)
Daily Session Note  Patient Details  Name: KALAN YELEY Sr. MRN: [id number] Date of Birth: 03-11-1931 Referring Provider:  Gustavo Lah, MD  Encounter Date: 10/16/2014  Check In:     Session Check In - 10/16/14 0934    Check-In   Staff Present Gerlene Burdock RN, BSN;Brynnly Bonet BS, ACSM EP-C, Exercise Physiologist;Other   ER physicians immediately available to respond to emergencies See telemetry face sheet for immediately available ER MD   Medication changes reported     No   Fall or balance concerns reported    No   Warm-up and Cool-down Performed on first and last piece of equipment   VAD Patient? No   Pain Assessment   Currently in Pain? No/denies         Goals Met:  Proper associated with RPD/PD & O2 Sat Exercise tolerated well No report of cardiac concerns or symptoms Strength training completed today  Goals Unmet:  Not Applicable  Goals Comments:   Dr. Emily Filbert is Medical Director for [place] and [facility].

## 2014-10-21 ENCOUNTER — Encounter: Payer: Medicare Other | Admitting: *Deleted

## 2014-10-21 DIAGNOSIS — Z951 Presence of aortocoronary bypass graft: Secondary | ICD-10-CM

## 2014-10-21 NOTE — Progress Notes (Signed)
Daily Session Note  Patient Details  Name: RONEY YOUTZ Sr. MRN: [id number] Date of Birth: 1931/08/13 Referring Provider:  Gustavo Lah, MD  Encounter Date: 10/21/2014  Check In:     Session Check In - 10/21/14 0857    Check-In   Staff Present Diane Joya Gaskins RN, BSN;Aztlan Coll Dillard Essex MS, ACSM CEP Exercise Physiologist;Other   ER physicians immediately available to respond to emergencies See telemetry face sheet for immediately available ER MD   Medication changes reported     No   Fall or balance concerns reported    No   Warm-up and Cool-down Performed on first and last piece of equipment   VAD Patient? No   Pain Assessment   Currently in Pain? No/denies   Multiple Pain Sites No           Exercise Prescription Changes - 10/21/14 0800    Exercise Review   Progression Yes   Response to Exercise   Symptoms No   Comments Reviewed individualized exercise prescription and made increases per departmental policy. Exercise increases were discussed with the patient and they were able to perform the new work loads without issue (no signs or symptoms).    Duration Progress to 50 minutes of aerobic without signs/symptoms of physical distress   Intensity Rest + 30   Progression Continue progressive overload as per policy without signs/symptoms or physical distress.   Resistance Training   Training Prescription Yes   Weight 2   Reps 10-15   Interval Training   Interval Training No   NuStep   Level 5   Watts 55   Minutes 20   Arm Ergometer   Level 2   Watts 10   Minutes 10      Goals Met:  Independence with exercise equipment Exercise tolerated well Personal goals reviewed No report of cardiac concerns or symptoms Strength training completed today  Goals Unmet:  Not Applicable  Goals Comments: Patient completed exercise prescription and all exercise goals during rehab session. The exercise was tolerated well and the patient is progressing in the program.    Dr.  Emily Filbert is Medical Director for [place] and [facility].

## 2014-10-28 DIAGNOSIS — Z951 Presence of aortocoronary bypass graft: Secondary | ICD-10-CM | POA: Diagnosis not present

## 2014-10-28 NOTE — Progress Notes (Signed)
Daily Session Note  Patient Details  Name: Travis MAJANO Sr. MRN: [id number] Date of Birth: Jan 10, 1931 Referring Provider:  Gustavo Lah, MD  Encounter Date: 10/28/2014  Check In:     Session Check In - 10/28/14 0846    Check-In   Staff Present Candiss Norse MS, ACSM CEP Exercise Physiologist;Diane Joya Gaskins RN, BSN;Other   ER physicians immediately available to respond to emergencies See telemetry face sheet for immediately available ER MD   Medication changes reported     No   Fall or balance concerns reported    No   Warm-up and Cool-down Performed on first and last piece of equipment   VAD Patient? No   Pain Assessment   Currently in Pain? No/denies         Goals Met:  Independence with exercise equipment Exercise tolerated well No report of cardiac concerns or symptoms Strength training completed today  Goals Unmet:  Not Applicable  Goals Comments: Continue with 10 minute intervals on equipment.    Dr. Emily Filbert is Medical Director for [place] and [facility].

## 2014-10-30 DIAGNOSIS — Z951 Presence of aortocoronary bypass graft: Secondary | ICD-10-CM

## 2014-10-30 NOTE — Progress Notes (Signed)
Daily Session Note  Patient Details  Name: Travis ARMITAGE Sr. MRN: [id number] Date of Birth: Feb 14, 1931 Referring Provider:  Gustavo Lah, MD  Encounter Date: 10/30/2014  Check In:     Session Check In - 10/30/14 0852    Check-In   Staff Present Lestine Box BS, ACSM EP-C, Exercise Physiologist;Carroll Enterkin RN, BSN;Other   ER physicians immediately available to respond to emergencies See telemetry face sheet for immediately available ER MD   Medication changes reported     No   Fall or balance concerns reported    No   Warm-up and Cool-down Performed on first and last piece of equipment   VAD Patient? No   Pain Assessment   Currently in Pain? No/denies         Goals Met:  Proper associated with RPD/PD & O2 Sat Exercise tolerated well No report of cardiac concerns or symptoms Strength training completed today  Goals Unmet:  Not Applicable  Goals Comments:    Dr. Emily Filbert is Medical Director for [place] and [facility].

## 2014-11-04 ENCOUNTER — Encounter: Payer: Medicare Other | Attending: Cardiothoracic Surgery

## 2014-11-04 DIAGNOSIS — Z951 Presence of aortocoronary bypass graft: Secondary | ICD-10-CM | POA: Insufficient documentation

## 2014-11-04 NOTE — Progress Notes (Signed)
Daily Session Note  Patient Details  Name: SHADD DUNSTAN Sr. MRN: [id number] Date of Birth: 05/05/1931 Referring Provider:  Gustavo Lah, MD  Encounter Date: 11/04/2014  Check In:     Session Check In - 11/04/14 0934    Check-In   Staff Present Candiss Norse MS, ACSM CEP Exercise Physiologist;Other;Diane Mariana Arn, BSN   ER physicians immediately available to respond to emergencies See telemetry face sheet for immediately available ER MD   Medication changes reported     No   Fall or balance concerns reported    No   Warm-up and Cool-down Performed on first and last piece of equipment   VAD Patient? No   Pain Assessment   Currently in Pain? No/denies           Exercise Prescription Changes - 11/04/14 0900    Exercise Review   Progression Yes   Response to Exercise   Symptoms No   Duration Progress to 50 minutes of aerobic without signs/symptoms of physical distress   Intensity Rest + 30   Progression Continue progressive overload as per policy without signs/symptoms or physical distress.   Resistance Training   Training Prescription Yes   Weight 2   Reps 10-15   Interval Training   Interval Training No   NuStep   Level 5   Watts 55   Minutes 20   Arm Ergometer   Level 2   Watts 10   Minutes 10   Recumbant Elliptical   Level 5  =BioStep   Watts 40   Minutes 15      Goals Met:  Exercise tolerated well No report of cardiac concerns or symptoms Strength training completed today  Goals Unmet:  Not Applicable  Goals Comments:    Dr. Emily Filbert is Medical Director for [facility].

## 2014-11-06 DIAGNOSIS — Z951 Presence of aortocoronary bypass graft: Secondary | ICD-10-CM | POA: Diagnosis not present

## 2014-11-06 NOTE — Progress Notes (Signed)
Daily Session Note  Patient Details  Name: Travis COLBERG Sr. MRN: [id number] Date of Birth: 1931/08/11 Referring Provider:  Gustavo Lah, MD  Encounter Date: 11/06/2014  Check In:     Session Check In - 11/06/14 0901    Check-In   Staff Present Lestine Box BS, ACSM EP-C, Exercise Physiologist;Carroll Enterkin RN, BSN;Other   ER physicians immediately available to respond to emergencies See telemetry face sheet for immediately available ER MD   Medication changes reported     No   Fall or balance concerns reported    No   Warm-up and Cool-down Performed on first and last piece of equipment   VAD Patient? No   Pain Assessment   Currently in Pain? No/denies         Goals Met:  Proper associated with RPD/PD & O2 Sat Exercise tolerated well No report of cardiac concerns or symptoms Strength training completed today  Goals Unmet:  Not Applicable  Goals Comments:    Dr. Emily Filbert is Medical Director for [facility].

## 2014-11-07 ENCOUNTER — Telehealth: Payer: Self-pay | Admitting: Dietician

## 2014-11-07 NOTE — Telephone Encounter (Signed)
Called patient's daughter, Ozella Rocksngela Pettiford, to see if she would like to meet with the dietitian as part of [facility] program.  Left a message requesting that she call regarding scheduling an appointment.

## 2014-11-12 ENCOUNTER — Encounter: Payer: Self-pay | Admitting: *Deleted

## 2014-11-12 DIAGNOSIS — Z951 Presence of aortocoronary bypass graft: Secondary | ICD-10-CM

## 2014-11-12 NOTE — Progress Notes (Signed)
Cardiac Individual Treatment Plan  Patient Details  Name: Travis ReekHarry G Demchak Sr. MRN: [id number] Date of Birth: [birth year] Referring Provider:  Lottie Dawsonaranasos, Thomas G, MD  Initial Encounter Date:    Visit Diagnosis: S/P CABG x 1  Patient's Home Medications on Admission:  Current outpatient prescriptions:  .  atorvastatin (LIPITOR) 80 MG tablet, Take by mouth., Disp: , Rfl:  .  donepezil (ARICEPT) 5 MG tablet, Take by mouth., Disp: , Rfl:  .  metoprolol tartrate (LOPRESSOR) 25 MG tablet, Take by mouth., Disp: , Rfl:  .  nicotine (RA NICOTINE) 21 mg/24hr patch, Place onto the skin., Disp: , Rfl:  .  nitroGLYCERIN (NITROSTAT) 0.4 MG SL tablet, Place under the tongue., Disp: , Rfl:  .  tamsulosin (FLOMAX) 0.4 MG CAPS capsule, Take by mouth., Disp: , Rfl:   Past Medical History: No past medical history on file.  Tobacco Use: History  Smoking status  . Former Smoker -- 1.50 packs/day for 60 years  . Types: Cigarettes  . Quit date: 11/03/2013  Smokeless tobacco  . Not on file    Labs: Recent Review Flowsheet Data    There is no flowsheet data to display.       Exercise Target Goals:    Exercise Program Goal: Individual exercise prescription set with THRR, safety & activity barriers. Participant demonstrates ability to understand and report RPE using BORG scale, to self-measure pulse accurately, and to acknowledge the importance of the exercise prescription.  Exercise Prescription Goal: Starting with aerobic activity 30 plus minutes a day, 3 days per week for initial exercise prescription. Provide home exercise prescription and guidelines that participant acknowledges understanding prior to discharge.  Activity Barriers & Risk Stratification:     Activity Barriers & Risk Stratification - 09/23/14 1023    Activity Barriers & Risk Stratification   Activity Barriers Other (comment)  Travis SailsHarry has dementia but does follow instructions and was able to do 6 minute walk test.    Risk  Stratification High      6 Minute Walk:     6 Minute Walk      09/23/14 0917       6 Minute Walk   Phase Initial     Distance 1140 feet     Walk Time 6 minutes     Resting HR 70 bpm     Resting BP 130/70 mmHg     Max Ex. HR 109 bpm     Max Ex. BP 132/68 mmHg     RPE 15     Symptoms No        Initial Exercise Prescription:     Initial Exercise Prescription - 10/02/14 1000    Date of Initial Exercise Prescription   Date 09/23/14   Treadmill   MPH 1.8   Grade 0   Minutes 10   Bike   Level --   Minutes --   Recumbant Bike   Level --   RPM --   Watts --   Minutes --   NuStep   Level 2   Watts 30   Minutes 10  Does better with 10min then 5 minutes   Arm Ergometer   Level 1   Watts 8   Minutes 10   Arm/Foot Ergometer   Level --   Watts --   Minutes --   Cybex   Level 3   RPM 60   Minutes 10   Recumbant Elliptical   Level --   RPM --   Watts --  Minutes --   REL-XR   Level 1   Watts 30   Minutes 15   Prescription Details   Frequency (times per week) 3   Intensity   THRR REST +  30   Ratings of Perceived Exertion 11-15   Progression Continue progressive overload as per policy without signs/symptoms or physical distress.   Resistance Training   Training Prescription Yes   Weight 2   Reps 10-15      Exercise Prescription Changes:     Exercise Prescription Changes      10/02/14 1000 10/02/14 1022 10/07/14 0800 10/13/14 1300 10/14/14 0800   Exercise Review   Progression    Yes Yes   Response to Exercise   Blood Pressure (Admit)    120/80 mmHg 120/80 mmHg   Blood Pressure (Exercise)    128/80 mmHg 128/80 mmHg   Blood Pressure (Exit)    120/70 mmHg 120/70 mmHg   Heart Rate (Admit)    81 bpm 81 bpm   Heart Rate (Exercise)    98 bpm 98 bpm   Heart Rate (Exit)    69 bpm 69 bpm   Rating of Perceived Exertion (Exercise)    13 13   Symptoms    No No   Duration    Progress to 50 minutes of aerobic without signs/symptoms of physical distress  Progress to 50 minutes of aerobic without signs/symptoms of physical distress   Intensity    Rest + 30 Rest + 30   Progression    Continue progressive overload as per policy without signs/symptoms or physical distress. Continue progressive overload as per policy without signs/symptoms or physical distress.   Resistance Training   Training Prescription    Yes Yes   Weight    2 2   Reps    10-15 10-15   Interval Training   Interval Training    No No   NuStep   Level 2 2 3 3 4    Watts 45 45 50 50 50   Minutes 10  Does well with , rest then 5 minutes 10  Does well with , rest then 5 minutes 10  Does well with , rest then 5 minutes 20 20   Arm Ergometer   Level    1 1   Watts    10 10   Minutes    10 10     10/21/14 0800 11/04/14 0900 11/11/14 1300       Exercise Review   Progression Yes Yes No     Response to Exercise   Blood Pressure (Admit)   122/72 mmHg     Blood Pressure (Exercise)   134/68 mmHg     Blood Pressure (Exit)   120/72 mmHg     Heart Rate (Admit)   72 bpm     Heart Rate (Exercise)   110 bpm     Heart Rate (Exit)   70 bpm     Rating of Perceived Exertion (Exercise)   12     Symptoms No No no     Comments Reviewed individualized exercise prescription and made increases per departmental policy. Exercise increases were discussed with the patient and they were able to perform the new work loads without issue (no signs or symptoms).        Duration Progress to 50 minutes of aerobic without signs/symptoms of physical distress Progress to 50 minutes of aerobic without signs/symptoms of physical distress Progress to 30 minutes of continuous aerobic without signs/symptoms  of physical distress     Intensity Rest + 30 Rest + 30 Rest + 30     Progression Continue progressive overload as per policy without signs/symptoms or physical distress. Continue progressive overload as per policy without signs/symptoms or physical distress. Continue progressive overload as per  policy without signs/symptoms or physical distress.     Resistance Training   Training Prescription Yes Yes Yes     Weight 2 2 2      Reps 10-15 10-15 10-15     Interval Training   Interval Training No No No     NuStep   Level 5 5 5      Watts 55 55 55     Minutes 20 20 20      Arm Ergometer   Level 2 2 2      Watts 10 10 10      Minutes 10 10 10      Recumbant Elliptical   Level  5  =BioStep 5  =BioStep     Watts  40 40     Minutes  15 15        Discharge Exercise Prescription (Final Exercise Prescription Changes):     Exercise Prescription Changes - 11/11/14 1300    Exercise Review   Progression No   Response to Exercise   Blood Pressure (Admit) 122/72 mmHg   Blood Pressure (Exercise) 134/68 mmHg   Blood Pressure (Exit) 120/72 mmHg   Heart Rate (Admit) 72 bpm   Heart Rate (Exercise) 110 bpm   Heart Rate (Exit) 70 bpm   Rating of Perceived Exertion (Exercise) 12   Symptoms no   Duration Progress to 30 minutes of continuous aerobic without signs/symptoms of physical distress   Intensity Rest + 30   Progression Continue progressive overload as per policy without signs/symptoms or physical distress.   Resistance Training   Training Prescription Yes   Weight 2   Reps 10-15   Interval Training   Interval Training No   NuStep   Level 5   Watts 55   Minutes 20   Arm Ergometer   Level 2   Watts 10   Minutes 10   Recumbant Elliptical   Level 5  =BioStep   Watts 40   Minutes 15      Nutrition:  Target Goals: Understanding of nutrition guidelines, daily intake of sodium 1500mg , cholesterol 200mg , calories 30% from fat and 7% or less from saturated fats, daily to have 5 or more servings of fruits and vegetables.  Biometrics:     Pre Biometrics - 09/23/14 0916    Pre Biometrics   Height 5' 8.6" (1.742 m)   Weight 144 lb (65.318 kg)   Waist Circumference 33.75 inches   Hip Circumference 35 inches   Waist to Hip Ratio 0.96 %   BMI (Calculated) 21.6        Nutrition Therapy Plan and Nutrition Goals:     Nutrition Therapy & Goals - 09/23/14 1026    Nutrition Therapy   Diet --  Travis Webb has dementia but his Daughter Travis Land is intrested in sodium info etc.   Drug/Food Interactions Statins/Certain Fruits   Intervention Plan   Intervention Using nutrition plan and personal goals to gain a healthy nutrition lifestyle. Add exercise as prescribed.      Nutrition Discharge: Rate Your Plate Scores:   Nutrition Goals Re-Evaluation:     Nutrition Goals Re-Evaluation      09/25/14 1216 10/02/14 1022  Personal Goal #1 Re-Evaluation   Personal Goal #1 Travis Webb left Travis Webb ) daughter a vm since daughter wants to know more informationa about sodium and meet with the dietician. I mentioned 1500 mg daily sodium intake is recommended. Her father could gain weight actually but has a decreased appetitie compared to when he used to work.        Goal Progress Seen Yes       Comments  Travis Webb has left daughter a vm. I will give her information about sodium.          Psychosocial: Target Goals: Acknowledge presence or absence of depression, maximize coping skills, provide positive support system. Participant is able to verbalize types and ability to use techniques and skills needed for reducing stress and depression.  Initial Review & Psychosocial Screening:     Initial Psych Review & Screening - 09/23/14 1028    Initial Review   Current issues with Current Sleep Concerns  Travis Webb has dementia. His daughter is taking care of him and her Grandchild full time.    Family Dynamics   Good Support System? Yes   Screening Interventions   Interventions Encouraged to exercise;Program counselor consult      Quality of Life Scores:     Quality of Life - 09/23/14 1241    Quality of Life Scores   Health/Function Pre 24.11 %  Duaghter asked Travis Webb these questions.   Socioeconomic Pre 27.08 %   Psych/Spiritual Pre 23.79 %   Family Pre  27.25 %   GLOBAL Pre 25.02 %      PHQ-9:     Recent Review Flowsheet Data    Depression screen [facility] 2/9 09/23/2014   Decreased Interest 0   Down, Depressed, Hopeless 1   PHQ - 2 Score 1   Altered sleeping 3   Tired, decreased energy 1   Change in appetite 1   Feeling bad or failure about yourself  0   Trouble concentrating 1   Moving slowly or fidgety/restless 1   Suicidal thoughts 0   PHQ-9 Score 8   Difficult doing work/chores Not difficult at all      Psychosocial Evaluation and Intervention:   Psychosocial Re-Evaluation:     Psychosocial Re-Evaluation      10/02/14 1024 10/15/14 1346         Psychosocial Re-Evaluation   Interventions Encouraged to attend Cardiac Rehabilitation for the exercise       Comments --  Daughter given info about Adult Day Care programs Dementia so we make sure his daughter picks him up. She is checking into Adult day care for him-given info about [facility].       Continued Psychosocial Services Needed  Yes         Vocational Rehabilitation: Provide vocational rehab assistance to qualifying candidates.   Vocational Rehab Evaluation & Intervention:     Vocational Rehab - 09/23/14 1025    Initial Vocational Rehab Evaluation & Intervention   Assessment shows need for Vocational Rehabilitation No      Education: Education Goals: Education classes will be provided on a weekly basis, covering required topics. Participant will state understanding/return demonstration of topics presented.  Learning Barriers/Preferences:     Learning Barriers/Preferences - 09/23/14 1024    Learning Barriers/Preferences   Learning Barriers Reading;Hearing   Learning Preferences None      Education Topics: General Nutrition Guidelines/Fats and Fiber: -Group instruction provided by verbal, written material, models and posters to present the  general guidelines for heart healthy nutrition. Gives an explanation and review of dietary fats  and fiber.          Cardiac Rehab from 11/06/2014 in [facility] Cardiac Rehab   Date  10/14/14   Educator  PI   Instruction Review Code  2- meets goals/outcomes      Controlling Sodium/Reading Food Labels: -Group verbal and written material supporting the discussion of sodium use in heart healthy nutrition. Review and explanation with models, verbal and written materials for utilization of the food label.      Cardiac Rehab from 11/06/2014 in [facility] Cardiac Rehab   Date  10/21/14   Educator  PI   Instruction Review Code  2- meets goals/outcomes      Exercise Physiology & Risk Factors: - Group verbal and written instruction with models to review the exercise physiology of the cardiovascular system and associated critical values. Details cardiovascular disease risk factors and the goals associated with each risk factor.      Cardiac Rehab from 11/06/2014 in [facility] Cardiac Rehab   Date  11/06/14   Educator  KM   Instruction Review Code  2- meets goals/outcomes      Aerobic Exercise & Resistance Training: - Gives group verbal and written discussion on the health impact of inactivity. On the components of aerobic and resistive training programs and the benefits of this training and how to safely progress through these programs.   Flexibility, Balance, General Exercise Guidelines: - Provides group verbal and written instruction on the benefits of flexibility and balance training programs. Provides general exercise guidelines with specific guidelines to those with heart or lung disease. Demonstration and skill practice provided.   Stress Management: - Provides group verbal and written instruction about the health risks of elevated stress, cause of high stress, and healthy ways to reduce stress.      Cardiac Rehab from 11/06/2014 in [facility] Cardiac Rehab   Date  09/25/14   Educator  C. Enterkin, RN   Instruction Review Code  2- meets goals/outcomes      Depression: - Provides group verbal and  written instruction on the correlation between heart/lung disease and depressed mood, treatment options, and the stigmas associated with seeking treatment.      Cardiac Rehab from 11/06/2014 in [facility] Cardiac Rehab   Date  10/30/14   Educator  CE   Instruction Review Code  2- meets goals/outcomes      Anatomy & Physiology of the Heart: - Group verbal and written instruction and models provide basic cardiac anatomy and physiology, with the coronary electrical and arterial systems. Review of: AMI, Angina, Valve disease, Heart Failure, Cardiac Arrhythmia, Pacemakers, and the ICD.      Cardiac Rehab from 11/06/2014 in [facility] Cardiac Rehab   Date  09/30/14   Educator  DW   Instruction Review Code  2- meets goals/outcomes      Cardiac Procedures: - Group verbal and written instruction and models to describe the testing methods done to diagnose heart disease. Reviews the outcomes of the test results. Describes the treatment choices: Medical Management, Angioplasty, or Coronary Bypass Surgery.      Cardiac Rehab from 11/06/2014 in [facility] Cardiac Rehab   Date  10/28/14   Educator  DW   Instruction Review Code  2- meets goals/outcomes      Cardiac Medications: - Group verbal and written instruction to review commonly prescribed medications for heart disease. Reviews the medication, class of the drug, and side effects. Includes the  steps to properly store meds and maintain the prescription regimen.   Go Sex-Intimacy & Heart Disease, Get SMART - Goal Setting: - Group verbal and written instruction through game format to discuss heart disease and the return to sexual intimacy. Provides group verbal and written material to discuss and apply goal setting through the application of the S.M.A.R.T. Method.      Cardiac Rehab from 11/06/2014 in [facility] Cardiac Rehab   Date  10/28/14   Educator  DW   Instruction Review Code  2- meets goals/outcomes      Other Matters of the Heart: - Provides group verbal,  written materials and models to describe Heart Failure, Angina, Valve Disease, and Diabetes in the realm of heart disease. Includes description of the disease process and treatment options available to the cardiac patient.      Cardiac Rehab from 11/06/2014 in [facility] Cardiac Rehab   Date  10/09/14   Educator  C. Enterkin, RN   Instruction Review Code  2- meets goals/outcomes      Exercise & Equipment Safety: - Individual verbal instruction and demonstration of equipment use and safety with use of the equipment.      Cardiac Rehab from 11/06/2014 in [facility] Cardiac Rehab   Date  10/02/14 [Discussed exer equipm can use at [employer] ours]   Educator  C. Enterkin,RN   Instruction Review Code  1- partially meets, needs review/practice      Infection Prevention: - Provides verbal and written material to individual with discussion of infection control including proper hand washing and proper equipment cleaning during exercise session.      Cardiac Rehab from 11/06/2014 in [facility] Cardiac Rehab   Date  09/23/14   Educator  C. Enterkin,RN   Instruction Review Code  2- meets goals/outcomes      Falls Prevention: - Provides verbal and written material to individual with discussion of falls prevention and safety.      Cardiac Rehab from 11/06/2014 in [facility] Cardiac Rehab   Date  09/23/14   Educator  C. Enterkin,RN   Instruction Review Code  1- partially meets, needs review/practice      Diabetes: - Individual verbal and written instruction to review signs/symptoms of diabetes, desired ranges of glucose level fasting, after meals and with exercise. Advice that pre and post exercise glucose checks will be done for 3 sessions at entry of program.    Knowledge Questionnaire Score:     Knowledge Questionnaire Score - 09/23/14 1025    Knowledge Questionnaire Score   Pre Score --  Not done-due to Travis Webb having dementia      Personal Goals and Risk Factors at Admission:     Personal Goals  and Risk Factors at Admission - 09/23/14 1027    Personal Goals and Risk Factors on Admission    Weight Management Yes   Intervention Learn and follow the exercise and diet guidelines while in the program. Utilize the nutrition and education classes to help gain knowledge of the diet and exercise expectations in the program  Needs to gain weight   Increase Aerobic Exercise and Physical Activity Yes   Hypertension Yes   Goal Participant will see blood pressure controlled within the values of 140/11mm/Hg or within value directed by their physician.   Intervention Provide nutrition & aerobic exercise along with prescribed medications to achieve BP 140/90 or less.   Lipids Yes   Goal Cholesterol controlled with medications as prescribed, with individualized exercise RX and with personalized nutrition plan.  Value goals: LDL < , HDL > . Participant states understanding of desired cholesterol values and following prescriptions.   Intervention Provide nutrition & aerobic exercise along with prescribed medications to achieve LDL 70mg , HDL >40mg .      Personal Goals and Risk Factors Review:      Goals and Risk Factor Review      09/25/14 1217 10/02/14 1023 10/02/14 1025 10/15/14 1345 11/12/14 0654   Increase Aerobic Exercise and Physical Activity   Goals Progress/Improvement seen  Yes Yes  Yes Yes   Comments Cortavious has dementia so exercise specialist spent a great deal of time with him explaining and setting him up on the Nustep. Rocky was able to exercise on the Nustep plus on the REL XR6000. Kenwood liked the XR6000.  Tayshon said "thanks for helping me, you all have helped me a lot". --  Ezriel will got to [employer] with his daughter when graduates from Cardiac Rehab.  Does well with a lot of encouragement and coaching to complete 10 minute increments of exercise on the Nustep sitting down.  Continues to work on exercise progression without problem   Hypertension   Goal  Participant will  see blood pressure controlled within the values of 140/52mm/Hg or within value directed by their physician.  Blood pressure today was 112/62 very good.  --  Stable blood pressure for Travis Webb.  Participant will see blood pressure controlled within the values of 140/2mm/Hg or within value directed by their physician.   Progress seen toward goals     Yes   Comments     BP readings doing well   Abnormal Lipids   Goal  Cholesterol controlled with medications as prescribed, with individualized exercise RX and with personalized nutrition plan. Value goals: LDL < , HDL > . Participant states understanding of desired cholesterol values and following prescriptions.  Taking cholestrol medicine.   Cholesterol controlled with medications as prescribed, with individualized exercise RX and with personalized nutrition plan. Value goals: LDL < , HDL > . Participant states understanding of desired cholesterol values and following prescriptions.   Progress seen towards goals     Unknown   Comments     Definitely working on risk factor control with the exercise regimen he is doing in program.      Personal Goals Discharge (Final Personal Goals and Risk Factors Review):      Goals and Risk Factor Review - 11/12/14 0654    Increase Aerobic Exercise and Physical Activity   Goals Progress/Improvement seen  Yes   Comments Continues to work on exercise progression without problem   Hypertension   Goal Participant will see blood pressure controlled within the values of 140/74mm/Hg or within value directed by their physician.   Progress seen toward goals Yes   Comments BP readings doing well   Abnormal Lipids   Goal Cholesterol controlled with medications as prescribed, with individualized exercise RX and with personalized nutrition plan. Value goals: LDL < , HDL > . Participant states understanding of desired cholesterol values and following prescriptions.   Progress seen towards goals Unknown    Comments Definitely working on risk factor control with the exercise regimen he is doing in program.       Comments: 30 day review. Continue with ITP.

## 2014-11-13 DIAGNOSIS — Z951 Presence of aortocoronary bypass graft: Secondary | ICD-10-CM | POA: Diagnosis not present

## 2014-11-13 NOTE — Progress Notes (Signed)
Daily Session Note  Patient Details  Name: Travis RUBY Sr. MRN: [id number] Date of Birth: 1931/01/11 Referring Provider:  Gustavo Lah, MD  Encounter Date: 11/13/2014  Check In:     Session Check In - 11/13/14 0857    Check-In   Staff Present Gerlene Burdock RN, BSN;Steven Way BS, ACSM EP-C, Exercise Physiologist;Other   ER physicians immediately available to respond to emergencies See telemetry face sheet for immediately available ER MD   Medication changes reported     No   Fall or balance concerns reported    No   Warm-up and Cool-down Performed on first and last piece of equipment   VAD Patient? No   Pain Assessment   Currently in Pain? No/denies           Exercise Prescription Changes - 11/13/14 0800    Exercise Review   Progression Yes   Response to Exercise   Symptoms no   Comments I reviewed [name]'s exercsie prescription with him and he was able to increase on the equipment with no signs or symptoms.    Duration Progress to 30 minutes of continuous aerobic without signs/symptoms of physical distress   Intensity Rest + 30   Progression Continue progressive overload as per policy without signs/symptoms or physical distress.   Resistance Training   Training Prescription Yes   Weight 2   Reps 10-15   Interval Training   Interval Training No   NuStep   Level 6   Watts 55   Minutes 20   Arm Ergometer   Level 2   Watts 10   Minutes 10   Recumbant Elliptical   Level 5  =BioStep   Watts 40   Minutes 15   REL-XR   Level 6   Watts 65   Minutes 15      Goals Met:  Exercise tolerated well No report of cardiac concerns or symptoms Strength training completed today  Goals Unmet:  Not Applicable  Goals Comments:    Dr. Emily Filbert is Medical Director for [place] and [facility].

## 2014-11-18 DIAGNOSIS — Z951 Presence of aortocoronary bypass graft: Secondary | ICD-10-CM | POA: Diagnosis not present

## 2014-11-18 NOTE — Progress Notes (Signed)
Daily Session Note  Patient Details  Name: Travis GOSNEY Sr. MRN: [id number] Date of Birth: [birth year] Referring Provider:  Gustavo Lah, MD  Encounter Date: 11/18/2014  Check In:     Session Check In - 11/18/14 0912    Check-In   Staff Present Candiss Norse MS, ACSM CEP Exercise Physiologist;Other;Diane Mariana Arn, BSN   ER physicians immediately available to respond to emergencies See telemetry face sheet for immediately available ER MD   Medication changes reported     No   Fall or balance concerns reported    No   Warm-up and Cool-down Performed on first and last piece of equipment   VAD Patient? No   Pain Assessment   Currently in Pain? No/denies         Goals Met:  Independence with exercise equipment Exercise tolerated well No report of cardiac concerns or symptoms Strength training completed today  Goals Unmet:  Not Applicable  Goals Comments:    Dr. Emily Filbert is Medical Director for [place] and [facility].

## 2014-11-20 DIAGNOSIS — Z951 Presence of aortocoronary bypass graft: Secondary | ICD-10-CM | POA: Diagnosis not present

## 2014-11-20 NOTE — Progress Notes (Signed)
Daily Session Note  Patient Details  Name: TRACI PLEMONS Sr. MRN: [id number] Date of Birth: [birth year] Referring Provider:  Gustavo Lah, MD  Encounter Date: 11/20/2014  Check In:     Session Check In - 11/20/14 0940    Check-In   Staff Present Lestine Box BS, ACSM EP-C, Exercise Physiologist;Carroll Enterkin RN, BSN;Other   ER physicians immediately available to respond to emergencies See telemetry face sheet for immediately available ER MD   Medication changes reported     No   Fall or balance concerns reported    No   Warm-up and Cool-down Performed on first and last piece of equipment   VAD Patient? No   Pain Assessment   Currently in Pain? No/denies         Goals Met:  Proper associated with RPD/PD & O2 Sat Exercise tolerated well No report of cardiac concerns or symptoms Strength training completed today  Goals Unmet:  Not Applicable  Goals Comments:    Dr. Emily Filbert is Medical Director for [place] and [facility].

## 2014-11-25 DIAGNOSIS — Z951 Presence of aortocoronary bypass graft: Secondary | ICD-10-CM

## 2014-11-25 NOTE — Progress Notes (Signed)
Cardiac Individual Treatment Plan  Patient Details  Name: Travis ReekHarry G Defelice Sr. MRN: [id number] Date of Birth: 1931/05/28 Referring Provider:  Lottie Dawsonaranasos, Thomas G, MD  Initial Encounter Date:    Visit Diagnosis: S/P CABG x 1  Patient's Home Medications on Admission:  Current outpatient prescriptions:  .  atorvastatin (LIPITOR) 80 MG tablet, Take by mouth., Disp: , Rfl:  .  donepezil (ARICEPT) 5 MG tablet, Take by mouth., Disp: , Rfl:  .  metoprolol tartrate (LOPRESSOR) 25 MG tablet, Take by mouth., Disp: , Rfl:  .  nicotine (RA NICOTINE) 21 mg/24hr patch, Place onto the skin., Disp: , Rfl:  .  nitroGLYCERIN (NITROSTAT) 0.4 MG SL tablet, Place under the tongue., Disp: , Rfl:  .  tamsulosin (FLOMAX) 0.4 MG CAPS capsule, Take by mouth., Disp: , Rfl:   Past Medical History: No past medical history on file.  Tobacco Use: History  Smoking status  . Former Smoker -- 1.50 packs/day for 60 years  . Types: Cigarettes  . Quit date: 11/03/2013  Smokeless tobacco  . Not on file    Labs: Recent Review Flowsheet Data    There is no flowsheet data to display.       Exercise Target Goals:    Exercise Program Goal: Individual exercise prescription set with THRR, safety & activity barriers. Participant demonstrates ability to understand and report RPE using BORG scale, to self-measure pulse accurately, and to acknowledge the importance of the exercise prescription.  Exercise Prescription Goal: Starting with aerobic activity 30 plus minutes a day, 3 days per week for initial exercise prescription. Provide home exercise prescription and guidelines that participant acknowledges understanding prior to discharge.  Activity Barriers & Risk Stratification:     Activity Barriers & Risk Stratification - 09/23/14 1023    Activity Barriers & Risk Stratification   Activity Barriers Other (comment)  Lollie SailsHarry has dementia but does follow instructions and was able to do 6 minute walk test.    Risk  Stratification High      6 Minute Walk:     6 Minute Walk      09/23/14 0917       6 Minute Walk   Phase Initial     Distance 1140 feet     Walk Time 6 minutes     Resting HR 70 bpm     Resting BP 130/70 mmHg     Max Ex. HR 109 bpm     Max Ex. BP 132/68 mmHg     RPE 15     Symptoms No        Initial Exercise Prescription:     Initial Exercise Prescription - 10/02/14 1000    Date of Initial Exercise Prescription   Date 09/23/14   Treadmill   MPH 1.8   Grade 0   Minutes 10   Bike   Level --   Minutes --   Recumbant Bike   Level --   RPM --   Watts --   Minutes --   NuStep   Level 2   Watts 30   Minutes 10  Does better with 10min then 5 minutes   Arm Ergometer   Level 1   Watts 8   Minutes 10   Arm/Foot Ergometer   Level --   Watts --   Minutes --   Cybex   Level 3   RPM 60   Minutes 10   Recumbant Elliptical   Level --   RPM --   Watts --  Minutes --   REL-XR   Level 1   Watts 30   Minutes 15   Prescription Details   Frequency (times per week) 3   Intensity   THRR REST +  30   Ratings of Perceived Exertion 11-15   Progression Continue progressive overload as per policy without signs/symptoms or physical distress.   Resistance Training   Training Prescription Yes   Weight 2   Reps 10-15      Exercise Prescription Changes:     Exercise Prescription Changes      10/02/14 1000 10/02/14 1022 10/07/14 0800 10/13/14 1300 10/14/14 0800   Exercise Review   Progression    Yes Yes   Response to Exercise   Blood Pressure (Admit)    120/80 mmHg 120/80 mmHg   Blood Pressure (Exercise)    128/80 mmHg 128/80 mmHg   Blood Pressure (Exit)    120/70 mmHg 120/70 mmHg   Heart Rate (Admit)    81 bpm 81 bpm   Heart Rate (Exercise)    98 bpm 98 bpm   Heart Rate (Exit)    69 bpm 69 bpm   Rating of Perceived Exertion (Exercise)    13 13   Symptoms    No No   Duration    Progress to 50 minutes of aerobic without signs/symptoms of physical distress  Progress to 50 minutes of aerobic without signs/symptoms of physical distress   Intensity    Rest + 30 Rest + 30   Progression    Continue progressive overload as per policy without signs/symptoms or physical distress. Continue progressive overload as per policy without signs/symptoms or physical distress.   Resistance Training   Training Prescription    Yes Yes   Weight    2 2   Reps    10-15 10-15   Interval Training   Interval Training    No No   NuStep   Level 2 2 3 3 4    Watts 45 45 50 50 50   Minutes 10  Does well with 10min, rest then 5 minutes 10  Does well with 10min, rest then 5 minutes 10  Does well with 10min, rest then 5 minutes 20 20   Arm Ergometer   Level    1 1   Watts    10 10   Minutes    10 10     10/21/14 0800 11/04/14 0900 11/11/14 1300 11/13/14 0800     Exercise Review   Progression Yes Yes No Yes    Response to Exercise   Blood Pressure (Admit)   122/72 mmHg     Blood Pressure (Exercise)   134/68 mmHg     Blood Pressure (Exit)   120/72 mmHg     Heart Rate (Admit)   72 bpm     Heart Rate (Exercise)   110 bpm     Heart Rate (Exit)   70 bpm     Rating of Perceived Exertion (Exercise)   12     Symptoms No No no no    Comments Reviewed individualized exercise prescription and made increases per departmental policy. Exercise increases were discussed with the patient and they were able to perform the new work loads without issue (no signs or symptoms).    I reviewed Coran's exercsie prescription with him and he was able to increase on the equipment with no signs or symptoms.     Duration Progress to 50 minutes of aerobic without signs/symptoms of physical  distress Progress to 50 minutes of aerobic without signs/symptoms of physical distress Progress to 30 minutes of continuous aerobic without signs/symptoms of physical distress Progress to 30 minutes of continuous aerobic without signs/symptoms of physical distress    Intensity Rest + 30 Rest + 30 Rest + 30 Rest +  30    Progression Continue progressive overload as per policy without signs/symptoms or physical distress. Continue progressive overload as per policy without signs/symptoms or physical distress. Continue progressive overload as per policy without signs/symptoms or physical distress. Continue progressive overload as per policy without signs/symptoms or physical distress.    Resistance Training   Training Prescription Yes Yes Yes Yes    Weight Reps 10-15 10-15 10-15 10-15    Interval Training   Interval Training No No No No    NuStep   Level Watts 55 55 55 55    Minutes Arm Ergometer   Level Watts Minutes Recumbant Elliptical   Level  5  =BioStep 5  =BioStep 5  =BioStep    Watts  40 40 40    Minutes  REL-XR   Level    6    Watts    65    Minutes    15       Discharge Exercise Prescription (Final Exercise Prescription Changes):     Exercise Prescription Changes - 11/13/14 0800    Exercise Review   Progression Yes   Response to Exercise   Symptoms no   Comments I reviewed Berthold's exercsie prescription with him and he was able to increase on the equipment with no signs or symptoms.    Duration Progress to 30 minutes of continuous aerobic without signs/symptoms of physical distress   Intensity Rest + 30   Progression Continue progressive overload as per policy without signs/symptoms or physical distress.   Resistance Training   Training Prescription Yes   Weight 2   Reps 10-15   Interval Training   Interval Training No   NuStep   Level 6   Watts 55   Minutes 20   Arm Ergometer   Level 2   Watts 10   Minutes 10   Recumbant Elliptical   Level 5  =BioStep   Watts 40   Minutes 15   REL-XR   Level 6   Watts 65   Minutes 15      Nutrition:  Target Goals: Understanding of nutrition guidelines, daily intake of sodium 1500mg , cholesterol 200mg , calories 30% from fat and  7% or less from saturated fats, daily to have 5 or more servings of fruits and vegetables.  Biometrics:     Pre Biometrics - 09/23/14 0916    Pre Biometrics   Height 5' 8.6" (1.742 m)   Weight 144 lb (65.318 kg)   Waist Circumference 33.75 inches   Hip Circumference 35 inches   Waist to Hip Ratio 0.96 %   BMI (Calculated) 21.6       Nutrition Therapy Plan and Nutrition Goals:     Nutrition Therapy & Goals - 09/23/14 1026    Nutrition Therapy   Diet --  Etheridge has dementia but his Daughter [name] is intrested in sodium info etc.   Drug/Food Interactions Statins/Certain Fruits  Intervention Plan   Intervention Using nutrition plan and personal goals to gain a healthy nutrition lifestyle. Add exercise as prescribed.      Nutrition Discharge: Rate Your Plate Scores:     Rate Your Plate - 81/19/14 1525    Rate Your Plate Scores   Pre Score --  Not done due to Jacobs has dementia.   Post Score --  Not done due to Lavere has dementia.      Nutrition Goals Re-Evaluation:     Nutrition Goals Re-Evaluation      09/25/14 1216 10/02/14 1022 11/25/14 1540       Personal Goal #1 Re-Evaluation   Personal Goal #1 Pam left [name] [name] ) daughter a vm since daughter wants to know more informationa about sodium and meet with the dietician. I mentioned 1500 mg daily sodium intake is recommended. Her father could gain weight actually but has a decreased appetitie compared to when he used to work.        Goal Progress Seen Yes  Yes     Comments  Pam has left daughter a vm. I will give her information about sodium.  Travaris and his daughter does not have any further questions about sodium etc.         Psychosocial: Target Goals: Acknowledge presence or absence of depression, maximize coping skills, provide positive support system. Participant is able to verbalize types and ability to use techniques and skills needed for reducing stress and depression.  Initial Review &  Psychosocial Screening:     Initial Psych Review & Screening - 09/23/14 1028    Initial Review   Current issues with Current Sleep Concerns  Matthe has dementia. His daughter is taking care of him and her Grandchild full time.    Family Dynamics   Good Support System? Yes   Screening Interventions   Interventions Encouraged to exercise;Program counselor consult      Quality of Life Scores:     Quality of Life - 11/25/14 1527    Quality of Life Scores   GLOBAL Post --  Not done due to Couper has dementia.      PHQ-9:     Recent Review Flowsheet Data    Depression screen [facility] 2/9 09/23/2014   Decreased Interest 0   Down, Depressed, Hopeless 1   PHQ - 2 Score 1   Altered sleeping 3   Tired, decreased energy 1   Change in appetite 1   Feeling bad or failure about yourself  0   Trouble concentrating 1   Moving slowly or fidgety/restless 1   Suicidal thoughts 0   PHQ-9 Score 8   Difficult doing work/chores Not difficult at all      Psychosocial Evaluation and Intervention:   Psychosocial Re-Evaluation:     Psychosocial Re-Evaluation      10/02/14 1024 10/15/14 1346 11/25/14 1540       Psychosocial Re-Evaluation   Interventions Encouraged to attend Cardiac Rehabilitation for the exercise       Comments --  Daughter given info about Adult Day Care programs Dementia so we make sure his daughter picks him up. She is checking into Adult day care for him-given info about [facility].  I spoke to Suheyb's daughter Ozella Rocks and she plans on taking him to [employer] to exercise with her after he is done with Cardiac Rehab. She put him in Cardiac Rehab just to make sure he would be ok. She would like to have  him walk on the treadmill.      Continued Psychosocial Services Needed  Yes         Vocational Rehabilitation: Provide vocational rehab assistance to qualifying candidates.   Vocational Rehab Evaluation & Intervention:     Vocational Rehab -  09/23/14 1025    Initial Vocational Rehab Evaluation & Intervention   Assessment shows need for Vocational Rehabilitation No      Education: Education Goals: Education classes will be provided on a weekly basis, covering required topics. Participant will state understanding/return demonstration of topics presented.  Learning Barriers/Preferences:     Learning Barriers/Preferences - 09/23/14 1024    Learning Barriers/Preferences   Learning Barriers Reading;Hearing   Learning Preferences None      Education Topics: General Nutrition Guidelines/Fats and Fiber: -Group instruction provided by verbal, written material, models and posters to present the general guidelines for heart healthy nutrition. Gives an explanation and review of dietary fats and fiber.          Cardiac Rehab from 11/25/2014 in [facility] Cardiac Rehab   Date  10/14/14   Educator  PI   Instruction Review Code  2- meets goals/outcomes      Controlling Sodium/Reading Food Labels: -Group verbal and written material supporting the discussion of sodium use in heart healthy nutrition. Review and explanation with models, verbal and written materials for utilization of the food label.      Cardiac Rehab from 11/25/2014 in [facility] Cardiac Rehab   Date  10/21/14   Educator  PI   Instruction Review Code  2- meets goals/outcomes      Exercise Physiology & Risk Factors: - Group verbal and written instruction with models to review the exercise physiology of the cardiovascular system and associated critical values. Details cardiovascular disease risk factors and the goals associated with each risk factor.      Cardiac Rehab from 11/25/2014 in [facility] Cardiac Rehab   Date  11/06/14   Educator  KM   Instruction Review Code  2- meets goals/outcomes      Aerobic Exercise & Resistance Training: - Gives group verbal and written discussion on the health impact of inactivity. On the components of aerobic and resistive training programs  and the benefits of this training and how to safely progress through these programs.   Flexibility, Balance, General Exercise Guidelines: - Provides group verbal and written instruction on the benefits of flexibility and balance training programs. Provides general exercise guidelines with specific guidelines to those with heart or lung disease. Demonstration and skill practice provided.      Cardiac Rehab from 11/25/2014 in [facility] Cardiac Rehab   Date  11/18/14   Educator  RM   Instruction Review Code  2- meets goals/outcomes      Stress Management: - Provides group verbal and written instruction about the health risks of elevated stress, cause of high stress, and healthy ways to reduce stress.      Cardiac Rehab from 11/25/2014 in [facility] Cardiac Rehab   Date  11/20/14   Educator  C. Karely Hurtado, RN   Instruction Review Code  2- meets goals/outcomes      Depression: - Provides group verbal and written instruction on the correlation between heart/lung disease and depressed mood, treatment options, and the stigmas associated with seeking treatment.      Cardiac Rehab from 11/25/2014 in [facility] Cardiac Rehab   Date  10/30/14   Educator  CE   Instruction Review Code  2- meets goals/outcomes  Anatomy & Physiology of the Heart: - Group verbal and written instruction and models provide basic cardiac anatomy and physiology, with the coronary electrical and arterial systems. Review of: AMI, Angina, Valve disease, Heart Failure, Cardiac Arrhythmia, Pacemakers, and the ICD.      Cardiac Rehab from 11/25/2014 in [facility] Cardiac Rehab   Date  09/30/14   Educator  DW   Instruction Review Code  2- meets goals/outcomes      Cardiac Procedures: - Group verbal and written instruction and models to describe the testing methods done to diagnose heart disease. Reviews the outcomes of the test results. Describes the treatment choices: Medical Management, Angioplasty, or Coronary Bypass Surgery.       Cardiac Rehab from 11/25/2014 in [facility] Cardiac Rehab   Date  10/28/14   Educator  DW   Instruction Review Code  2- meets goals/outcomes      Cardiac Medications: - Group verbal and written instruction to review commonly prescribed medications for heart disease. Reviews the medication, class of the drug, and side effects. Includes the steps to properly store meds and maintain the prescription regimen.      Cardiac Rehab from 11/25/2014 in [facility] Cardiac Rehab   Date  11/13/14   Educator  C.Caspian Deleonardis, RN   Instruction Review Code  2- meets goals/outcomes      Go Sex-Intimacy & Heart Disease, Get SMART - Goal Setting: - Group verbal and written instruction through game format to discuss heart disease and the return to sexual intimacy. Provides group verbal and written material to discuss and apply goal setting through the application of the S.M.A.R.T. Method.      Cardiac Rehab from 11/25/2014 in [facility] Cardiac Rehab   Date  10/28/14   Educator  DW   Instruction Review Code  2- meets goals/outcomes      Other Matters of the Heart: - Provides group verbal, written materials and models to describe Heart Failure, Angina, Valve Disease, and Diabetes in the realm of heart disease. Includes description of the disease process and treatment options available to the cardiac patient.      Cardiac Rehab from 11/25/2014 in [facility] Cardiac Rehab   Date  10/09/14   Educator  C. Hezikiah Retzloff, RN   Instruction Review Code  2- meets goals/outcomes      Exercise & Equipment Safety: - Individual verbal instruction and demonstration of equipment use and safety with use of the equipment.      Cardiac Rehab from 11/25/2014 in [facility] Cardiac Rehab   Date  10/02/14 [Discussed exer equipm can use at [employer] ours]   Educator  C. Juwaun Inskeep,RN   Instruction Review Code  1- partially meets, needs review/practice      Infection Prevention: - Provides verbal and written material to individual with discussion of  infection control including proper hand washing and proper equipment cleaning during exercise session.      Cardiac Rehab from 11/25/2014 in [facility] Cardiac Rehab   Date  09/23/14   Educator  C. Lavaeh Bau,RN   Instruction Review Code  2- meets goals/outcomes      Falls Prevention: - Provides verbal and written material to individual with discussion of falls prevention and safety.      Cardiac Rehab from 11/25/2014 in [facility] Cardiac Rehab   Date  09/23/14   Educator  C. Fernande Treiber,RN   Instruction Review Code  1- partially meets, needs review/practice      Diabetes: - Individual verbal and written instruction to review signs/symptoms of diabetes, desired  ranges of glucose level fasting, after meals and with exercise. Advice that pre and post exercise glucose checks will be done for 3 sessions at entry of program.    Knowledge Questionnaire Score:     Knowledge Questionnaire Score - 11/25/14 1525    Knowledge Questionnaire Score   Pre Score --  Not done due to Merrik has dementia.   Post Score --  Not done due to Ward has dementia.      Personal Goals and Risk Factors at Admission:     Personal Goals and Risk Factors at Admission - 09/23/14 1027    Personal Goals and Risk Factors on Admission    Weight Management Yes   Intervention Learn and follow the exercise and diet guidelines while in the program. Utilize the nutrition and education classes to help gain knowledge of the diet and exercise expectations in the program  Needs to gain weight   Increase Aerobic Exercise and Physical Activity Yes   Hypertension Yes   Goal Participant will see blood pressure controlled within the values of 140/47mm/Hg or within value directed by their physician.   Intervention Provide nutrition & aerobic exercise along with prescribed medications to achieve BP 140/90 or less.   Lipids Yes   Goal Cholesterol controlled with medications as prescribed, with individualized exercise RX and with  personalized nutrition plan. Value goals: LDL < , HDL > . Participant states understanding of desired cholesterol values and following prescriptions.   Intervention Provide nutrition & aerobic exercise along with prescribed medications to achieve LDL 70mg , HDL >40mg .      Personal Goals and Risk Factors Review:      Goals and Risk Factor Review      09/25/14 1217 10/02/14 1023 10/02/14 1025 10/15/14 1345 11/12/14 0654   Increase Aerobic Exercise and Physical Activity   Goals Progress/Improvement seen  Yes Yes  Yes Yes   Comments Walfred has dementia so exercise specialist spent a great deal of time with him explaining and setting him up on the Nustep. Neziah was able to exercise on the Nustep plus on the REL XR6000. Dowell liked the XR6000.  Robbie said "thanks for helping me, you all have helped me a lot". --  Tandy will got to [employer] with his daughter when graduates from Cardiac Rehab.  Does well with a lot of encouragement and coaching to complete 10 minute increments of exercise on the Nustep sitting down.  Continues to work on exercise progression without problem   Hypertension   Goal  Participant will see blood pressure controlled within the values of 140/3mm/Hg or within value directed by their physician.  Blood pressure today was 112/62 very good.  --  Stable blood pressure for [name].  Participant will see blood pressure controlled within the values of 140/42mm/Hg or within value directed by their physician.   Progress seen toward goals     Yes   Comments     BP readings doing well   Abnormal Lipids   Goal  Cholesterol controlled with medications as prescribed, with individualized exercise RX and with personalized nutrition plan. Value goals: LDL < , HDL > . Participant states understanding of desired cholesterol values and following prescriptions.  Taking cholestrol medicine.   Cholesterol controlled with medications as prescribed, with individualized exercise RX  and with personalized nutrition plan. Value goals: LDL < , HDL > . Participant states understanding of desired cholesterol values and following prescriptions.   Progress seen towards goals     Unknown  Comments     Definitely working on risk factor control with the exercise regimen he is doing in program.     11/25/14 1525 11/25/14 1539         Increase Aerobic Exercise and Physical Activity   Goals Progress/Improvement seen  Yes Yes      Comments Hamad with encouragement will do increments on the Recumbent Elliptical XR6000. With his dementia, Mads needs direction which we give him. His daughter brings him and drops him off with [place] and we make sure he does not leave anytime except with his daughter.  I spoke to Tavaughn's daughter Ozella Rocks and she plans on taking him to [employer] to exercise with her after he is done with Cardiac Rehab. She put him in Cardiac Rehab just to make sure he would be ok. She would like to have him walk on the treadmill.          Personal Goals Discharge (Final Personal Goals and Risk Factors Review):      Goals and Risk Factor Review - 11/25/14 1539    Increase Aerobic Exercise and Physical Activity   Goals Progress/Improvement seen  Yes   Comments I spoke to Alcide's daughter Ozella Rocks and she plans on taking him to [employer] to exercise with her after he is done with Cardiac Rehab. She put him in Cardiac Rehab just to make sure he would be ok. She would like to have him walk on the treadmill.       ITP Comments:   Comments: I spoke to Shmiel's daughter Ozella Rocks and she plans on taking him to [employer] to exercise with her after he is done with Cardiac Rehab. She put him in Cardiac Rehab just to make sure he would be ok. She would like to have him walk on the treadmill. They have no further questions about nutrition/sodium intake.

## 2014-11-25 NOTE — Progress Notes (Signed)
Cardiac Individual Treatment Plan  Patient Details  Name: Travis ReekHarry G Demchak Sr. MRN: [id number] Date of Birth: [birth year] Referring Provider:  Lottie Dawsonaranasos, Thomas G, MD  Initial Encounter Date:    Visit Diagnosis: S/P CABG x 1  Patient's Home Medications on Admission:  Current outpatient prescriptions:  .  atorvastatin (LIPITOR) 80 MG tablet, Take by mouth., Disp: , Rfl:  .  donepezil (ARICEPT) 5 MG tablet, Take by mouth., Disp: , Rfl:  .  metoprolol tartrate (LOPRESSOR) 25 MG tablet, Take by mouth., Disp: , Rfl:  .  nicotine (RA NICOTINE) 21 mg/24hr patch, Place onto the skin., Disp: , Rfl:  .  nitroGLYCERIN (NITROSTAT) 0.4 MG SL tablet, Place under the tongue., Disp: , Rfl:  .  tamsulosin (FLOMAX) 0.4 MG CAPS capsule, Take by mouth., Disp: , Rfl:   Past Medical History: No past medical history on file.  Tobacco Use: History  Smoking status  . Former Smoker -- 1.50 packs/day for 60 years  . Types: Cigarettes  . Quit date: 11/03/2013  Smokeless tobacco  . Not on file    Labs: Recent Review Flowsheet Data    There is no flowsheet data to display.       Exercise Target Goals:    Exercise Program Goal: Individual exercise prescription set with THRR, safety & activity barriers. Participant demonstrates ability to understand and report RPE using BORG scale, to self-measure pulse accurately, and to acknowledge the importance of the exercise prescription.  Exercise Prescription Goal: Starting with aerobic activity 30 plus minutes a day, 3 days per week for initial exercise prescription. Provide home exercise prescription and guidelines that participant acknowledges understanding prior to discharge.  Activity Barriers & Risk Stratification:     Activity Barriers & Risk Stratification - 09/23/14 1023    Activity Barriers & Risk Stratification   Activity Barriers Other (comment)  Travis SailsHarry has dementia but does follow instructions and was able to do 6 minute walk test.    Risk  Stratification High      6 Minute Walk:     6 Minute Walk      09/23/14 0917       6 Minute Walk   Phase Initial     Distance 1140 feet     Walk Time 6 minutes     Resting HR 70 bpm     Resting BP 130/70 mmHg     Max Ex. HR 109 bpm     Max Ex. BP 132/68 mmHg     RPE 15     Symptoms No        Initial Exercise Prescription:     Initial Exercise Prescription - 10/02/14 1000    Date of Initial Exercise Prescription   Date 09/23/14   Treadmill   MPH 1.8   Grade 0   Minutes 10   Bike   Level --   Minutes --   Recumbant Bike   Level --   RPM --   Watts --   Minutes --   NuStep   Level 2   Watts 30   Minutes 10  Does better with 10min then 5 minutes   Arm Ergometer   Level 1   Watts 8   Minutes 10   Arm/Foot Ergometer   Level --   Watts --   Minutes --   Cybex   Level 3   RPM 60   Minutes 10   Recumbant Elliptical   Level --   RPM --   Watts --  Minutes --   REL-XR   Level 1   Watts 30   Minutes 15   Prescription Details   Frequency (times per week) 3   Intensity   THRR REST +  30   Ratings of Perceived Exertion 11-15   Progression Continue progressive overload as per policy without signs/symptoms or physical distress.   Resistance Training   Training Prescription Yes   Weight 2   Reps 10-15      Exercise Prescription Changes:     Exercise Prescription Changes      10/02/14 1000 10/02/14 1022 10/07/14 0800 10/13/14 1300 10/14/14 0800   Exercise Review   Progression    Yes Yes   Response to Exercise   Blood Pressure (Admit)    120/80 mmHg 120/80 mmHg   Blood Pressure (Exercise)    128/80 mmHg 128/80 mmHg   Blood Pressure (Exit)    120/70 mmHg 120/70 mmHg   Heart Rate (Admit)    81 bpm 81 bpm   Heart Rate (Exercise)    98 bpm 98 bpm   Heart Rate (Exit)    69 bpm 69 bpm   Rating of Perceived Exertion (Exercise)    13 13   Symptoms    No No   Duration    Progress to 50 minutes of aerobic without signs/symptoms of physical distress  Progress to 50 minutes of aerobic without signs/symptoms of physical distress   Intensity    Rest + 30 Rest + 30   Progression    Continue progressive overload as per policy without signs/symptoms or physical distress. Continue progressive overload as per policy without signs/symptoms or physical distress.   Resistance Training   Training Prescription    Yes Yes   Weight    2 2   Reps    10-15 10-15   Interval Training   Interval Training    No No   NuStep   Level 2 2 3 3 4    Watts 45 45 50 50 50   Minutes 10  Does well with 10min, rest then 5 minutes 10  Does well with 10min, rest then 5 minutes 10  Does well with 10min, rest then 5 minutes 20 20   Arm Ergometer   Level    1 1   Watts    10 10   Minutes    10 10     10/21/14 0800 11/04/14 0900 11/11/14 1300 11/13/14 0800     Exercise Review   Progression Yes Yes No Yes    Response to Exercise   Blood Pressure (Admit)   122/72 mmHg     Blood Pressure (Exercise)   134/68 mmHg     Blood Pressure (Exit)   120/72 mmHg     Heart Rate (Admit)   72 bpm     Heart Rate (Exercise)   110 bpm     Heart Rate (Exit)   70 bpm     Rating of Perceived Exertion (Exercise)   12     Symptoms No No no no    Comments Reviewed individualized exercise prescription and made increases per departmental policy. Exercise increases were discussed with the patient and they were able to perform the new work loads without issue (no signs or symptoms).    I reviewed Travis Webb's exercsie prescription with him and he was able to increase on the equipment with no signs or symptoms.     Duration Progress to 50 minutes of aerobic without signs/symptoms of physical  distress Progress to 50 minutes of aerobic without signs/symptoms of physical distress Progress to 30 minutes of continuous aerobic without signs/symptoms of physical distress Progress to 30 minutes of continuous aerobic without signs/symptoms of physical distress    Intensity Rest + 30 Rest + 30 Rest + 30 Rest +  30    Progression Continue progressive overload as per policy without signs/symptoms or physical distress. Continue progressive overload as per policy without signs/symptoms or physical distress. Continue progressive overload as per policy without signs/symptoms or physical distress. Continue progressive overload as per policy without signs/symptoms or physical distress.    Resistance Training   Training Prescription Yes Yes Yes Yes    Weight Reps 10-15 10-15 10-15 10-15    Interval Training   Interval Training No No No No    NuStep   Level Watts 55 55 55 55    Minutes Arm Ergometer   Level Watts Minutes Recumbant Elliptical   Level  5  =BioStep 5  =BioStep 5  =BioStep    Watts  40 40 40    Minutes  REL-XR   Level    6    Watts    65    Minutes    15       Discharge Exercise Prescription (Final Exercise Prescription Changes):     Exercise Prescription Changes - 11/13/14 0800    Exercise Review   Progression Yes   Response to Exercise   Symptoms no   Comments I reviewed Berthold's exercsie prescription with him and he was able to increase on the equipment with no signs or symptoms.    Duration Progress to 30 minutes of continuous aerobic without signs/symptoms of physical distress   Intensity Rest + 30   Progression Continue progressive overload as per policy without signs/symptoms or physical distress.   Resistance Training   Training Prescription Yes   Weight 2   Reps 10-15   Interval Training   Interval Training No   NuStep   Level 6   Watts 55   Minutes 20   Arm Ergometer   Level 2   Watts 10   Minutes 10   Recumbant Elliptical   Level 5  =BioStep   Watts 40   Minutes 15   REL-XR   Level 6   Watts 65   Minutes 15      Nutrition:  Target Goals: Understanding of nutrition guidelines, daily intake of sodium 1500mg , cholesterol 200mg , calories 30% from fat and  7% or less from saturated fats, daily to have 5 or more servings of fruits and vegetables.  Biometrics:     Pre Biometrics - 09/23/14 0916    Pre Biometrics   Height 5' 8.6" (1.742 m)   Weight 144 lb (65.318 kg)   Waist Circumference 33.75 inches   Hip Circumference 35 inches   Waist to Hip Ratio 0.96 %   BMI (Calculated) 21.6       Nutrition Therapy Plan and Nutrition Goals:     Nutrition Therapy & Goals - 09/23/14 1026    Nutrition Therapy   Diet --  Travis Webb has dementia but his Daughter Travis Land is intrested in sodium info etc.   Drug/Food Interactions Statins/Certain Fruits  Intervention Plan   Intervention Using nutrition plan and personal goals to gain a healthy nutrition lifestyle. Add exercise as prescribed.      Nutrition Discharge: Rate Your Plate Scores:     Rate Your Plate - 16/10/96 1525    Rate Your Plate Scores   Pre Score --  Not done due to Norton has dementia.   Post Score --  Not done due to Travis Webb has dementia.      Nutrition Goals Re-Evaluation:     Nutrition Goals Re-Evaluation      09/25/14 1216 10/02/14 1022         Personal Goal #1 Re-Evaluation   Personal Goal #1 Travis Webb ) daughter a vm since daughter wants to know more informationa about sodium and meet with the dietician. I mentioned 1500 mg daily sodium intake is recommended. Her father could gain weight actually but has a decreased appetitie compared to when he used to work.        Goal Progress Seen Yes       Comments  Travis Webb has left daughter a vm. I will give her information about sodium.          Psychosocial: Target Goals: Acknowledge presence or absence of depression, maximize coping skills, provide positive support system. Participant is able to verbalize types and ability to use techniques and skills needed for reducing stress and depression.  Initial Review & Psychosocial Screening:     Initial Psych Review & Screening - 09/23/14 1028    Initial Review    Current issues with Current Sleep Concerns  Travis Webb has dementia. His daughter is taking care of him and her Grandchild full time.    Family Dynamics   Good Support System? Yes   Screening Interventions   Interventions Encouraged to exercise;Program counselor consult      Quality of Life Scores:     Quality of Life - 11/25/14 1527    Quality of Life Scores   GLOBAL Post --  Not done due to Travis Webb has dementia.      PHQ-9:     Recent Review Flowsheet Data    Depression screen Travis Webb 2/9 09/23/2014   Decreased Interest 0   Down, Depressed, Hopeless 1   PHQ - 2 Score 1   Altered sleeping 3   Tired, decreased energy 1   Change in appetite 1   Feeling bad or failure about yourself  0   Trouble concentrating 1   Moving slowly or fidgety/restless 1   Suicidal thoughts 0   PHQ-9 Score 8   Difficult doing work/chores Not difficult at all      Psychosocial Evaluation and Intervention:   Psychosocial Re-Evaluation:     Psychosocial Re-Evaluation      10/02/14 1024 10/15/14 1346         Psychosocial Re-Evaluation   Interventions Encouraged to attend Cardiac Rehabilitation for the exercise       Comments --  Daughter given info about Adult Day Care programs Dementia so we make sure his daughter picks him up. She is checking into Adult day care for him-given info about [facility].       Continued Psychosocial Services Needed  Yes         Vocational Rehabilitation: Provide vocational rehab assistance to qualifying candidates.   Vocational Rehab Evaluation & Intervention:     Vocational Rehab - 09/23/14 1025    Initial Vocational Rehab Evaluation & Intervention   Assessment shows need for  Vocational Rehabilitation No      Education: Education Goals: Education classes will be provided on a weekly basis, covering required topics. Participant will state understanding/return demonstration of topics presented.  Learning Barriers/Preferences:     Learning  Barriers/Preferences - 09/23/14 1024    Learning Barriers/Preferences   Learning Barriers Reading;Hearing   Learning Preferences None      Education Topics: General Nutrition Guidelines/Fats and Fiber: -Group instruction provided by verbal, written material, models and posters to present the general guidelines for heart healthy nutrition. Gives an explanation and review of dietary fats and fiber.          Cardiac Rehab from 11/25/2014 in [facility] Re[facility]   Date  10/14/14   Educator  PI   Instruction Review Code  2- meets goals/outcomes      Controlling Sodium/Reading Food Labels: -Group verbal and written material supporting the discussion of sodium use in heart healthy nutrition. Review and explanation with models, verbal and written materials for utilization of the food label.      Cardiac Rehab from 11/25/2014 in [facility] Cardiac Rehab   Date  10/21/14   Educator  PI   Instruction Review Code  2- meets goals/outcomes      Exercise Physiology & Risk Factors: - Group verbal and written instruction with models to review the exercise physiology of the cardiovascular system and associated critical values. Details cardiovascular disease risk factors and the goals associated with each risk factor.      Cardiac Rehab from 11/25/2014 in [facility] Cardiac Rehab   Date  11/06/14   Educator  KM   Instruction Review Code  2- meets goals/outcomes      Aerobic Exercise & Resistance Training: - Gives group verbal and written discussion on the health impact of inactivity. On the components of aerobic and resistive training programs and the benefits of this training and how to safely progress through these programs.   Flexibility, Balance, General Exercise Guidelines: - Provides group verbal and written instruction on the benefits of flexibility and balance training programs. Provides general exercise guidelines with specific guidelines to those with heart or lung disease. Demonstration and skill  practice provided.      Cardiac Rehab from 11/25/2014 in [facility]   Date  11/18/14   Educator  RM   Instruction Review Code  2- meets goals/outcomes      Stress Management: - Provides group verbal and written instruction about the health risks of elevated stress, cause of high stress, and healthy ways to reduce stress.      Cardiac Rehab from 11/25/2014 in [facility]   Date  11/20/14   Educator  C. Jia Mohamed, RN   Instruction Review Code  2- meets goals/outcomes      Depression: - Provides group verbal and written instruction on the correlation between heart/lung disease and depressed mood, treatment options, and the stigmas associated with seeking treatment.      Cardiac Rehab from 11/25/2014 in [facility] Cardiac Rehab   Date  10/30/14   Educator  CE   Instruction Review Code  2- meets goals/outcomes      Anatomy & Physiology of the Heart: - Group verbal and written instruction and models provide basic cardiac anatomy and physiology, with the coronary electrical and arterial systems. Review of: AMI, Angina, Valve disease, Heart Failure, Cardiac Arrhythmia, Pacemakers, and the ICD.      Cardiac Rehab from 11/25/2014 in [facility]   Date  09/30/14   Educator  DW   Instruction Review Code  2- meets goals/outcomes      Cardiac Procedures: - Group verbal and written instruction and models to describe the testing methods done to diagnose heart disease. Reviews the outcomes of the test results. Describes the treatment choices: Medical Management, Angioplasty, or Coronary Bypass Surgery.      Cardiac Rehab from 11/25/2014 in [facility] Rehab   Date  10/28/14   Educator  DW   Instruction Review Code  2- meets goals/outcomes      Cardiac Medications: - Group verbal and written instruction to review commonly prescribed medications for heart disease. Reviews the medication, class of the drug, and side effects. Includes the steps to properly store meds and  maintain the prescription regimen.      Cardiac Rehab from 11/25/2014 in [facility]   Date  11/13/14   Educator  C.Axell Trigueros, RN   Instruction Review Code  2- meets goals/outcomes      Go Sex-Intimacy & Heart Disease, Get SMART - Goal Setting: - Group verbal and written instruction through game format to discuss heart disease and the return to sexual intimacy. Provides group verbal and written material to discuss and apply goal setting through the application of the S.M.A.R.T. Method.      Cardiac Rehab from 11/25/2014 in [facility]   Date  10/28/14   Educator  DW   Instruction Review Code  2- meets goals/outcomes      Other Matters of the Heart: - Provides group verbal, written materials and models to describe Heart Failure, Angina, Valve Disease, and Diabetes in the realm of heart disease. Includes description of the disease process and treatment options available to the cardiac patient.      Cardiac Rehab from 11/25/2014 in [facility] Cardiac Rehab   Date  10/09/14   Educator  C. Kay Ricciuti, RN   Instruction Review Code  2- meets goals/outcomes      Exercise & Equipment Safety: - Individual verbal instruction and demonstration of equipment use and safety with use of the equipment.      Cardiac Rehab from 11/25/2014 in [facility] Cardiac Rehab   Date  10/02/14 [Discussed exer equipm can use at [employer] [facility] ours]   Educator  C. Carmelita Amparo,RN   Instruction Review Code  1- partially meets, needs review/practice      Infection Prevention: - Provides verbal and written material to individual with discussion of infection control including proper hand washing and proper equipment cleaning during exercise session.      Cardiac Rehab from 11/25/2014 in [facility] Cardiac Re[facility]   Date  09/23/14   Educator  C. Alton Bouknight,RN   Instruction Review Code  2- meets goals/outcomes      Falls Prevention: - Provides verbal and written material to individual with discussion of falls prevention  and safety.      Cardiac Rehab from 11/25/2014 in [facility] Cardiac Rehab   Date  09/23/14   Educator  C. Ragna Kramlich,RN   Instruction Review Code  1- partially meets, needs review/practice      Diabetes: - Individual verbal and written instruction to review signs/symptoms of diabetes, desired ranges of glucose level fasting, after meals and with exercise. Advice that pre and post exercise glucose checks will be done for 3 sessions at entry of program.    Knowledge Questionnaire Score:     Knowledge Questionnaire Score - 11/25/14 1525    Knowledge Questionnaire Score   Pre Score --  Not done due to Travis Webb has dementia.  Post Score --  Not done due to Travis Webb has dementia.      Personal Goals and Risk Factors at Admission:     Personal Goals and Risk Factors at Admission - 09/23/14 1027    Personal Goals and Risk Factors on Admission    Weight Management Yes   Intervention Learn and follow the exercise and diet guidelines while in the program. Utilize the nutrition and education classes to help gain knowledge of the diet and exercise expectations in the program  Needs to gain weight   Increase Aerobic Exercise and Physical Activity Yes   Hypertension Yes   Goal Participant will see blood pressure controlled within the values of 140/54mm/Hg or within value directed by their physician.   Intervention Provide nutrition & aerobic exercise along with prescribed medications to achieve BP 140/90 or less.   Lipids Yes   Goal Cholesterol controlled with medications as prescribed, with individualized exercise RX and with personalized nutrition plan. Value goals: LDL < 70mg , HDL > 40mg . Participant states understanding of desired cholesterol values and following prescriptions.   Intervention Provide nutrition & aerobic exercise along with prescribed medications to achieve LDL 70mg , HDL >40mg .      Personal Goals and Risk Factors Review:      Goals and Risk Factor Review      09/25/14 1217  10/02/14 1023 10/02/14 1025 10/15/14 1345 11/12/14 0654   Increase Aerobic Exercise and Physical Activity   Goals Progress/Improvement seen  Yes Yes  Yes Yes   Comments Travis Webb has dementia so exercise specialist spent a great deal of time with him explaining and setting him up on the Nustep. Travis Webb was able to exercise on the Nustep plus on the REL XR6000. Travis Webb liked the XR6000.  Travis Webb said "thanks for helping me, you all have helped me a lot". --  Travis Webb will got to Travis Webb with his daughter when graduates from Cardiac Rehab.  Does well with a lot of encouragement and coaching to complete 10 minute increments of exercise on the Nustep sitting down.  Continues to work on exercise progression without problem   Hypertension   Goal  Participant will see blood pressure controlled within the values of 140/24mm/Hg or within value directed by their physician.  Blood pressure today was 112/62 very good.  --  Stable blood pressure for Travis Webb.  Participant will see blood pressure controlled within the values of 140/7mm/Hg or within value directed by their physician.   Progress seen toward goals     Yes   Comments     BP readings doing well   Abnormal Lipids   Goal  Cholesterol controlled with medications as prescribed, with individualized exercise RX and with personalized nutrition plan. Value goals: LDL < 70mg , HDL > 40mg . Participant states understanding of desired cholesterol values and following prescriptions.  Taking cholestrol medicine.   Cholesterol controlled with medications as prescribed, with individualized exercise RX and with personalized nutrition plan. Value goals: LDL < 70mg , HDL > 40mg . Participant states understanding of desired cholesterol values and following prescriptions.   Progress seen towards goals     Unknown   Comments     Definitely working on risk factor control with the exercise regimen he is doing in program.     11/25/14 1525           Increase Aerobic Exercise and  Physical Activity   Goals Progress/Improvement seen  Yes       Comments Finnean with encouragement will do increments on  the Recumbent Elliptical XR6000. With his dementia, Travis Webb needs direction which we give him. His daughter brings him and drops him off with [place] and we make sure he does not leave anytime except with his daughter.           Personal Goals Discharge (Final Personal Goals and Risk Factors Review):      Goals and Risk Factor Review - 11/25/14 1525    Increase Aerobic Exercise and Physical Activity   Goals Progress/Improvement seen  Yes   Comments Jashawn with encouragement will do increments on the Recumbent Elliptical XR6000. With his dementia, Kendle needs direction which we give him. His daughter brings him and drops him off with [place] and we make sure he does not leave anytime except with his daughter.       ITP Comments:   Comments: Yisrael has dementia so we did not ask him to complete the post questions. His family does not stay for Cardiac Rehab education usually although they know they can attend.

## 2014-11-25 NOTE — Progress Notes (Signed)
Daily Session Note  Patient Details  Name: Travis STOCKLEY Sr. MRN: [id number] Date of Birth: 25-Apr-1931 Referring Provider:  Gustavo Lah, MD  Encounter Date: 11/25/2014  Check In:     Session Check In - 11/25/14 0854    Check-In   Staff Present Nyoka Cowden, RN;Carroll Enterkin, RN, BSN;Hartman Minahan, BS, ACSM EP-C, Exercise Physiologist   ER physicians immediately available to respond to emergencies See telemetry face sheet for immediately available ER MD   Medication changes reported     No   Fall or balance concerns reported    No   Warm-up and Cool-down Performed on first and last piece of equipment   VAD Patient? No   Pain Assessment   Currently in Pain? No/denies         Goals Met:  Proper associated with RPD/PD & O2 Sat Exercise tolerated well No report of cardiac concerns or symptoms Strength training completed today  Goals Unmet:  Not Applicable  Goals Comments:    Dr. Emily Filbert is Medical Director for [facility].

## 2014-12-02 DIAGNOSIS — Z951 Presence of aortocoronary bypass graft: Secondary | ICD-10-CM

## 2014-12-02 NOTE — Progress Notes (Signed)
Daily Session Note  Patient Details  Name: SIAN JOLES Sr. MRN: [id number] Date of Birth: Dec 25, 1931 Referring Provider:  Gustavo Lah, MD  Encounter Date: 12/02/2014  Check In:     Session Check In - 12/02/14 0958    Check-In   Staff Present Candiss Norse, MS, ACSM CEP, Exercise Physiologist;Diane Joya Gaskins, RN, BSN;Matheus Spiker, BS, ACSM EP-C, Exercise Physiologist   ER physicians immediately available to respond to emergencies See telemetry face sheet for immediately available ER MD   Medication changes reported     No   Fall or balance concerns reported    No   Warm-up and Cool-down Performed on first and last piece of equipment   VAD Patient? No   Pain Assessment   Currently in Pain? No/denies         Goals Met:  Proper associated with RPD/PD & O2 Sat Exercise tolerated well No report of cardiac concerns or symptoms Strength training completed today  Goals Unmet:  Not Applicable  Goals Comments:    Dr. Emily Filbert is Medical Director for [place] and [facility].

## 2014-12-04 ENCOUNTER — Encounter: Payer: Medicare Other | Attending: Cardiothoracic Surgery

## 2014-12-04 DIAGNOSIS — Z951 Presence of aortocoronary bypass graft: Secondary | ICD-10-CM | POA: Diagnosis not present

## 2014-12-04 NOTE — Progress Notes (Signed)
Daily Session Note  Patient Details  Name: Travis ISHIDA Sr. MRN: [id number] Date of Birth: 1931-05-24 Referring Provider:  Gustavo Lah, MD  Encounter Date: 12/04/2014  Check In:     Session Check In - 12/04/14 0859    Check-In   Staff Present Hessie Knows, BS, Exercise Physiologist;Carroll Enterkin, RN, BSN;Steven Way, BS, ACSM EP-C, Exercise Physiologist   ER physicians immediately available to respond to emergencies See telemetry face sheet for immediately available ER MD   Medication changes reported     No   Fall or balance concerns reported    No   Warm-up and Cool-down Performed on first and last piece of equipment   VAD Patient? No   Pain Assessment   Currently in Pain? No/denies         Goals Met:  Independence with exercise equipment Exercise tolerated well No report of cardiac concerns or symptoms Strength training completed today  Goals Unmet:  Not Applicable  Goals Comments:    Dr. Emily Filbert is Medical Director for [place] and [facility].

## 2014-12-09 ENCOUNTER — Encounter: Payer: Medicare Other | Admitting: *Deleted

## 2014-12-09 VITALS — Ht 68.6 in | Wt 150.1 lb

## 2014-12-09 DIAGNOSIS — Z951 Presence of aortocoronary bypass graft: Secondary | ICD-10-CM | POA: Diagnosis not present

## 2014-12-09 NOTE — Progress Notes (Signed)
Daily Session Note  Patient Details  Name: Travis HAILES Sr. MRN: [id number] Date of Birth: 1931-10-18 Referring Provider:  Gustavo Lah, MD  Encounter Date: 12/09/2014  Check In:     Session Check In - 12/09/14 0908    Check-In   Staff Present Nyoka Cowden, RN;Renee Dillard [place], MS, ACSM CEP, Exercise Physiologist;Zuzu Befort Joya Gaskins, RN, BSN   ER physicians immediately available to respond to emergencies See telemetry face sheet for immediately available ER MD   Medication changes reported     No   Fall or balance concerns reported    No   Warm-up and Cool-down Performed on first and last piece of equipment   VAD Patient? No   Pain Assessment   Currently in Pain? No/denies   Multiple Pain Sites No         Goals Met:  Exercise tolerated well No report of cardiac concerns or symptoms Strength training completed today  Goals Unmet:  Not Applicable  Goals Comments: Patient completing program at the direction of the staff.  Patient is able to exercise, but not independent with initiating work out on machines.  Patient needs instructions and encouragement to keep exercising.     Dr. Emily Filbert is Medical Director for [place] and [facility].

## 2014-12-09 NOTE — Progress Notes (Signed)
Cardiac Individual Treatment Plan  Patient Details  Name: Travis KEITHLY Sr. MRN: [id number] Date of Birth: Dec 05, 1931 Referring Provider:  Lottie Dawson, MD  Initial Encounter Date:    Visit Diagnosis: S/P CABG x 1  Patient's Home Medications on Admission:  Current outpatient prescriptions:  .  atorvastatin (LIPITOR) 80 MG tablet, Take by mouth., Disp: , Rfl:  .  donepezil (ARICEPT) 5 MG tablet, Take by mouth., Disp: , Rfl:  .  metoprolol tartrate (LOPRESSOR) 25 MG tablet, Take by mouth., Disp: , Rfl:  .  nicotine (RA NICOTINE) 21 mg/24hr patch, Place onto the skin., Disp: , Rfl:  .  nitroGLYCERIN (NITROSTAT) 0.4 MG SL tablet, Place under the tongue., Disp: , Rfl:  .  tamsulosin (FLOMAX) 0.4 MG CAPS capsule, Take by mouth., Disp: , Rfl:   Past Medical History: No past medical history on file.  Tobacco Use: History  Smoking status  . Former Smoker -- 1.50 packs/day for 60 years  . Types: Cigarettes  . Quit date: 11/03/2013  Smokeless tobacco  . Not on file    Labs: Recent Review Flowsheet Data    There is no flowsheet data to display.       Exercise Target Goals:    Exercise Program Goal: Individual exercise prescription set with THRR, safety & activity barriers. Participant demonstrates ability to understand and report RPE using BORG scale, to self-measure pulse accurately, and to acknowledge the importance of the exercise prescription.  Exercise Prescription Goal: Starting with aerobic activity 30 plus minutes a day, 3 days per week for initial exercise prescription. Provide home exercise prescription and guidelines that participant acknowledges understanding prior to discharge.  Activity Barriers & Risk Stratification:     Activity Barriers & Risk Stratification - 09/23/14 1023    Activity Barriers & Risk Stratification   Activity Barriers Other (comment)  Harlee has dementia but does follow instructions and was able to do 6 minute walk test.    Risk  Stratification High      6 Minute Walk:     6 Minute Walk      09/23/14 0917 12/09/14 0946     6 Minute Walk   Phase Initial Discharge    Distance 1140 feet 1350 feet    Distance % Change  18 %    Walk Time 6 minutes 6 minutes    Resting HR 70 bpm 92 bpm    Resting BP 130/70 mmHg 112/80 mmHg    Max Ex. HR 109 bpm 139 bpm    Max Ex. BP 132/68 mmHg 130/78 mmHg    RPE 15 15    Symptoms No No       Initial Exercise Prescription:     Initial Exercise Prescription - 10/02/14 1000    Date of Initial Exercise Prescription   Date 09/23/14   Treadmill   MPH 1.8   Grade 0   Minutes 10   Bike   Level --   Minutes --   Recumbant Bike   Level --   RPM --   Watts --   Minutes --   NuStep   Level 2   Watts 30   Minutes 10  Does better with then 5 minutes   Arm Ergometer   Level 1   Watts 8   Minutes 10   Arm/Foot Ergometer   Level --   Watts --   Minutes --   Cybex   Level 3   RPM 60   Minutes 10  Recumbant Elliptical   Level --   RPM --   Watts --   Minutes --   REL-XR   Level 1   Watts 30   Minutes 15   Prescription Details   Frequency (times per week) 3   Intensity   THRR REST +  30   Ratings of Perceived Exertion 11-15   Progression Continue progressive overload as per policy without signs/symptoms or physical distress.   Resistance Training   Training Prescription Yes   Weight 2   Reps 10-15      Exercise Prescription Changes:     Exercise Prescription Changes      10/02/14 1000 10/02/14 1022 10/07/14 0800 10/13/14 1300 10/14/14 0800   Exercise Review   Progression    Yes Yes   Response to Exercise   Blood Pressure (Admit)    120/80 mmHg 120/80 mmHg   Blood Pressure (Exercise)    128/80 mmHg 128/80 mmHg   Blood Pressure (Exit)    120/70 mmHg 120/70 mmHg   Heart Rate (Admit)    81 bpm 81 bpm   Heart Rate (Exercise)    98 bpm 98 bpm   Heart Rate (Exit)    69 bpm 69 bpm   Rating of Perceived Exertion (Exercise)    13 13    Symptoms    No No   Duration    Progress to 50 minutes of aerobic without signs/symptoms of physical distress Progress to 50 minutes of aerobic without signs/symptoms of physical distress   Intensity    Rest + 30 Rest + 30   Progression    Continue progressive overload as per policy without signs/symptoms or physical distress. Continue progressive overload as per policy without signs/symptoms or physical distress.   Resistance Training   Training Prescription    Yes Yes   Weight    2 2   Reps    10-15 10-15   Interval Training   Interval Training    No No   NuStep   Level 2 2 3 3 4    Watts 45 45 50 50 50   Minutes 10  Does well with , rest then 5 minutes 10  Does well with , rest then 5 minutes 10  Does well with , rest then 5 minutes 20 20   Arm Ergometer   Level    1 1   Watts    10 10   Minutes    10 10     10/21/14 0800 11/04/14 0900 11/11/14 1300 11/13/14 0800 11/20/14 1611   Exercise Review   Progression Yes Yes No Yes No   Response to Exercise   Blood Pressure (Admit)   122/72 mmHg  124/76 mmHg   Blood Pressure (Exercise)   134/68 mmHg  138/74 mmHg   Blood Pressure (Exit)   120/72 mmHg  130/80 mmHg   Heart Rate (Admit)   72 bpm  81 bpm   Heart Rate (Exercise)   110 bpm  107 bpm   Heart Rate (Exit)   70 bpm  86 bpm   Rating of Perceived Exertion (Exercise)   12  12   Symptoms No No no no no   Comments Reviewed individualized exercise prescription and made increases per departmental policy. Exercise increases were discussed with the patient and they were able to perform the new work loads without issue (no signs or symptoms).    I reviewed Caleel's exercsie prescription with him and he was able to increase on  the equipment with no signs or symptoms.     Duration Progress to 50 minutes of aerobic without signs/symptoms of physical distress Progress to 50 minutes of aerobic without signs/symptoms of physical distress Progress to 30 minutes of continuous aerobic  without signs/symptoms of physical distress Progress to 30 minutes of continuous aerobic without signs/symptoms of physical distress Progress to 30 minutes of continuous aerobic without signs/symptoms of physical distress   Intensity Rest + 30 Rest + 30 Rest + 30 Rest + 30 Rest + 30   Progression Continue progressive overload as per policy without signs/symptoms or physical distress. Continue progressive overload as per policy without signs/symptoms or physical distress. Continue progressive overload as per policy without signs/symptoms or physical distress. Continue progressive overload as per policy without signs/symptoms or physical distress. Continue progressive overload as per policy without signs/symptoms or physical distress.   Resistance Training   Training Prescription Yes Yes Yes Yes Yes   Weight 2 2 2 2 2    Reps 10-15 10-15 10-15 10-15 10-15   Interval Training   Interval Training No No No No No   NuStep   Level 5 5 5 6 5    Watts 55 55 55 55 55   Minutes 20 20 20 20 20    Arm Ergometer   Level 2 2 2 2 2    Watts 10 10 10 10 10    Minutes 10 10 10 10 10    Recumbant Elliptical   Level  5  =BioStep 5  =BioStep 5  =BioStep 5   Watts  40 40 40 40   Minutes  15 15 15 15    REL-XR   Level    6 6   Watts    65 65   Minutes    15 15     12/09/14 0900           Exercise Review   Progression No       Response to Exercise   Rating of Perceived Exertion (Exercise) 12       Symptoms no       Comments Completed Basir's post today and reviewed the exercise goals he has completed in this program. Also reviewed his transition to a home exercise or community exercise facility. Due to Tami's communication limitations we will reach out to his family and/or care providers to get a plan in place for home exercise.        Duration Progress to 30 minutes of continuous aerobic without signs/symptoms of physical distress       Intensity Rest + 30       Progression Continue progressive  overload as per policy without signs/symptoms or physical distress.       Resistance Training   Training Prescription Yes       Weight 2       Reps 10-15       Interval Training   Interval Training No       NuStep   Level 5       Watts 55       Minutes 20       Arm Ergometer   Level 2       Watts 10       Minutes 10       Recumbant Elliptical   Level 5       Watts 40       Minutes 15       REL-XR   Level 6  Watts 65       Minutes 15       Home Exercise Plan   Plans to continue exercise at Home  Need to confirm plans with his daughter          Discharge Exercise Prescription (Final Exercise Prescription Changes):     Exercise Prescription Changes - 12/09/14 0900    Exercise Review   Progression No   Response to Exercise   Rating of Perceived Exertion (Exercise) 12   Symptoms no   Comments Completed Tyreon's post today and reviewed the exercise goals he has completed in this program. Also reviewed his transition to a home exercise or community exercise facility. Due to Duvall's communication limitations we will reach out to his family and/or care providers to get a plan in place for home exercise.    Duration Progress to 30 minutes of continuous aerobic without signs/symptoms of physical distress   Intensity Rest + 30   Progression Continue progressive overload as per policy without signs/symptoms or physical distress.   Resistance Training   Training Prescription Yes   Weight 2   Reps 10-15   Interval Training   Interval Training No   NuStep   Level 5   Watts 55   Minutes 20   Arm Ergometer   Level 2   Watts 10   Minutes 10   Recumbant Elliptical   Level 5   Watts 40   Minutes 15   REL-XR   Level 6   Watts 65   Minutes 15   Home Exercise Plan   Plans to continue exercise at Home  Need to confirm plans with his daughter      Nutrition:  Target Goals: Understanding of nutrition guidelines, daily intake of sodium 1500mg , cholesterol 200mg ,  calories 30% from fat and 7% or less from saturated fats, daily to have 5 or more servings of fruits and vegetables.  Biometrics:     Pre Biometrics - 09/23/14 0916    Pre Biometrics   Height 5' 8.6" (1.742 m)   Weight 144 lb (65.318 kg)   Waist Circumference 33.75 inches   Hip Circumference 35 inches   Waist to Hip Ratio 0.96 %   BMI (Calculated) 21.6         Post Biometrics - 12/09/14 0949     Post  Biometrics   Height 5' 8.6" (1.742 m)   Weight 150 lb 1.6 oz (68.085 kg)   Waist Circumference 34.5 inches   Hip Circumference 35 inches   Waist to Hip Ratio 0.99 %   BMI (Calculated) 22.5      Nutrition Therapy Plan and Nutrition Goals:     Nutrition Therapy & Goals - 09/23/14 1026    Nutrition Therapy   Diet --  Janzen has dementia but his Daughter [name] is intrested in sodium info etc.   Drug/Food Interactions Statins/Certain Fruits   Intervention Plan   Intervention Using nutrition plan and personal goals to gain a healthy nutrition lifestyle. Add exercise as prescribed.      Nutrition Discharge: Rate Your Plate Scores:     Rate Your Plate - 16/10/96 1525    Rate Your Plate Scores   Pre Score --  Not done due to Schawn has dementia.   Post Score --  Not done due to Mitch has dementia.      Nutrition Goals Re-Evaluation:     Nutrition Goals Re-Evaluation      09/25/14 1216 10/02/14 1022 11/25/14 1540  Personal Goal #1 Re-Evaluation   Personal Goal #1 Pam left [name] [name] ) daughter a vm since daughter wants to know more informationa about sodium and meet with the dietician. I mentioned 1500 mg daily sodium intake is recommended. Her father could gain weight actually but has a decreased appetitie compared to when he used to work.        Goal Progress Seen Yes  Yes     Comments  Pam has left daughter a vm. I will give her information about sodium.  Sherrell and his daughter does not have any further questions about sodium etc.          Psychosocial: Target Goals: Acknowledge presence or absence of depression, maximize coping skills, provide positive support system. Participant is able to verbalize types and ability to use techniques and skills needed for reducing stress and depression.  Initial Review & Psychosocial Screening:     Initial Psych Review & Screening - 09/23/14 1028    Initial Review   Current issues with Current Sleep Concerns  Biagio has dementia. His daughter is taking care of him and her Grandchild full time.    Family Dynamics   Good Support System? Yes   Screening Interventions   Interventions Encouraged to exercise;Program counselor consult      Quality of Life Scores:     Quality of Life - 11/25/14 1527    Quality of Life Scores   GLOBAL Post --  Not done due to Fran has dementia.      PHQ-9:     Recent Review Flowsheet Data    Depression screen [facility] 2/9 09/23/2014   Decreased Interest 0   Down, Depressed, Hopeless 1   PHQ - 2 Score 1   Altered sleeping 3   Tired, decreased energy 1   Change in appetite 1   Feeling bad or failure about yourself  0   Trouble concentrating 1   Moving slowly or fidgety/restless 1   Suicidal thoughts 0   PHQ-9 Score 8   Difficult doing work/chores Not difficult at all      Psychosocial Evaluation and Intervention:   Psychosocial Re-Evaluation:     Psychosocial Re-Evaluation      10/02/14 1024 10/15/14 1346 11/25/14 1540       Psychosocial Re-Evaluation   Interventions Encouraged to attend Cardiac Rehabilitation for the exercise       Comments --  Daughter given info about Adult Day Care programs Dementia so we make sure his daughter picks him up. She is checking into Adult day care for him-given info about [facility].  I spoke to Eragon's daughter Ozella Rocks and she plans on taking him to [employer] to exercise with her after he is done with Cardiac Rehab. She put him in Cardiac Rehab just to make sure he would be  ok. She would like to have him walk on the treadmill.      Continued Psychosocial Services Needed  Yes         Vocational Rehabilitation: Provide vocational rehab assistance to qualifying candidates.   Vocational Rehab Evaluation & Intervention:     Vocational Rehab - 09/23/14 1025    Initial Vocational Rehab Evaluation & Intervention   Assessment shows need for Vocational Rehabilitation No      Education: Education Goals: Education classes will be provided on a weekly basis, covering required topics. Participant will state understanding/return demonstration of topics presented.  Learning Barriers/Preferences:     Learning Barriers/Preferences - 09/23/14  1024    Learning Barriers/Preferences   Learning Barriers Reading;Hearing   Learning Preferences None      Education Topics: General Nutrition Guidelines/Fats and Fiber: -Group instruction provided by verbal, written material, models and posters to present the general guidelines for heart healthy nutrition. Gives an explanation and review of dietary fats and fiber.          Cardiac Rehab from 12/09/2014 in [facility] Cardiac Rehab   Date  12/09/14   Educator  PI   Instruction Review Code  2- meets goals/outcomes      Controlling Sodium/Reading Food Labels: -Group verbal and written material supporting the discussion of sodium use in heart healthy nutrition. Review and explanation with models, verbal and written materials for utilization of the food label.      Cardiac Rehab from 12/09/2014 in [facility] Cardiac Rehab   Date  10/21/14   Educator  PI   Instruction Review Code  2- meets goals/outcomes      Exercise Physiology & Risk Factors: - Group verbal and written instruction with models to review the exercise physiology of the cardiovascular system and associated critical values. Details cardiovascular disease risk factors and the goals associated with each risk factor.      Cardiac Rehab from 12/09/2014 in [facility] Cardiac Rehab    Date  11/06/14   Educator  KM   Instruction Review Code  2- meets goals/outcomes      Aerobic Exercise & Resistance Training: - Gives group verbal and written discussion on the health impact of inactivity. On the components of aerobic and resistive training programs and the benefits of this training and how to safely progress through these programs.   Flexibility, Balance, General Exercise Guidelines: - Provides group verbal and written instruction on the benefits of flexibility and balance training programs. Provides general exercise guidelines with specific guidelines to those with heart or lung disease. Demonstration and skill practice provided.      Cardiac Rehab from 12/09/2014 in [facility] Cardiac Rehab   Date  11/18/14   Educator  RM   Instruction Review Code  2- meets goals/outcomes      Stress Management: - Provides group verbal and written instruction about the health risks of elevated stress, cause of high stress, and healthy ways to reduce stress.      Cardiac Rehab from 12/09/2014 in [facility] Cardiac Rehab   Date  11/20/14   Educator  C. Enterkin, RN   Instruction Review Code  2- meets goals/outcomes      Depression: - Provides group verbal and written instruction on the correlation between heart/lung disease and depressed mood, treatment options, and the stigmas associated with seeking treatment.      Cardiac Rehab from 12/09/2014 in [facility] Cardiac Rehab   Date  10/30/14   Educator  CE   Instruction Review Code  2- meets goals/outcomes      Anatomy & Physiology of the Heart: - Group verbal and written instruction and models provide basic cardiac anatomy and physiology, with the coronary electrical and arterial systems. Review of: AMI, Angina, Valve disease, Heart Failure, Cardiac Arrhythmia, Pacemakers, and the ICD.      Cardiac Rehab from 12/09/2014 in [facility] Cardiac Rehab   Date  09/30/14   Educator  DW   Instruction Review Code  2- meets goals/outcomes      Cardiac  Procedures: - Group verbal and written instruction and models to describe the testing methods done to diagnose heart disease. Reviews the outcomes of the test results.  Describes the treatment choices: Medical Management, Angioplasty, or Coronary Bypass Surgery.      Cardiac Rehab from 12/09/2014 in [facility] Cardiac Rehab   Date  10/28/14   Educator  DW   Instruction Review Code  2- meets goals/outcomes      Cardiac Medications: - Group verbal and written instruction to review commonly prescribed medications for heart disease. Reviews the medication, class of the drug, and side effects. Includes the steps to properly store meds and maintain the prescription regimen.      Cardiac Rehab from 12/09/2014 in [facility] Cardiac Rehab   Date  11/13/14   Educator  C.Enterkin, RN   Instruction Review Code  2- meets goals/outcomes      Go Sex-Intimacy & Heart Disease, Get SMART - Goal Setting: - Group verbal and written instruction through game format to discuss heart disease and the return to sexual intimacy. Provides group verbal and written material to discuss and apply goal setting through the application of the S.M.A.R.T. Method.      Cardiac Rehab from 12/09/2014 in [facility] Cardiac Rehab   Date  10/28/14   Educator  DW   Instruction Review Code  2- meets goals/outcomes      Other Matters of the Heart: - Provides group verbal, written materials and models to describe Heart Failure, Angina, Valve Disease, and Diabetes in the realm of heart disease. Includes description of the disease process and treatment options available to the cardiac patient.      Cardiac Rehab from 12/09/2014 in [facility] Cardiac Rehab   Date  12/04/14   Educator  C. Enterkin, RN   Instruction Review Code  2- meets goals/outcomes      Exercise & Equipment Safety: - Individual verbal instruction and demonstration of equipment use and safety with use of the equipment.      Cardiac Rehab from 12/09/2014 in [facility] Cardiac Rehab   Date   10/02/14 [Discussed exer equipm can use at [employer] ours]   Educator  C. Enterkin,RN   Instruction Review Code  1- partially meets, needs review/practice      Infection Prevention: - Provides verbal and written material to individual with discussion of infection control including proper hand washing and proper equipment cleaning during exercise session.      Cardiac Rehab from 12/09/2014 in [facility] Cardiac Rehab   Date  09/23/14   Educator  C. Enterkin,RN   Instruction Review Code  2- meets goals/outcomes      Falls Prevention: - Provides verbal and written material to individual with discussion of falls prevention and safety.      Cardiac Rehab from 12/09/2014 in [facility] Cardiac Rehab   Date  09/23/14   Educator  C. Enterkin,RN   Instruction Review Code  1- partially meets, needs review/practice      Diabetes: - Individual verbal and written instruction to review signs/symptoms of diabetes, desired ranges of glucose level fasting, after meals and with exercise. Advice that pre and post exercise glucose checks will be done for 3 sessions at entry of program.    Knowledge Questionnaire Score:     Knowledge Questionnaire Score - 11/25/14 1525    Knowledge Questionnaire Score   Pre Score --  Not done due to Deklin has dementia.   Post Score --  Not done due to Halbert has dementia.      Personal Goals and Risk Factors at Admission:     Personal Goals and Risk Factors at Admission - 09/23/14 1027    Personal  Goals and Risk Factors on Admission    Weight Management Yes   Intervention Learn and follow the exercise and diet guidelines while in the program. Utilize the nutrition and education classes to help gain knowledge of the diet and exercise expectations in the program  Needs to gain weight   Increase Aerobic Exercise and Physical Activity Yes   Hypertension Yes   Goal Participant will see blood pressure controlled within the values of 140/8090mm/Hg or within value  directed by their physician.   Intervention Provide nutrition & aerobic exercise along with prescribed medications to achieve BP 140/90 or less.   Lipids Yes   Goal Cholesterol controlled with medications as prescribed, with individualized exercise RX and with personalized nutrition plan. Value goals: LDL < 70mg , HDL > 40mg . Participant states understanding of desired cholesterol values and following prescriptions.   Intervention Provide nutrition & aerobic exercise along with prescribed medications to achieve LDL 70mg , HDL >40mg .      Personal Goals and Risk Factors Review:      Goals and Risk Factor Review      09/25/14 1217 10/02/14 1023 10/02/14 1025 10/15/14 1345 11/12/14 0654   Increase Aerobic Exercise and Physical Activity   Goals Progress/Improvement seen  Yes Yes  Yes Yes   Comments Lollie SailsHarry has dementia so exercise specialist spent a great deal of time with him explaining and setting him up on the Nustep. Lollie SailsHarry was able to exercise on the Nustep plus on the REL XR6000. Lollie SailsHarry liked the XR6000.  Lollie SailsHarry said "thanks for helping me, you all have helped me a lot". --  Lollie SailsHarry will [facility] [employer] with his daughter when graduates from Cardiac Rehab.  Does well with a lot of encouragement and coaching to complete 10 minute increments of exercise on the Nustep sitting down.  Continues to work on exercise progression without problem   Hypertension   Goal  Participant will see blood pressure controlled within the values of 140/1690mm/Hg or within value directed by their physician.  Blood pressure today was 112/62 very good.  --  Stable blood pressure for Lollie SailsHarry.  Participant will see blood pressure controlled within the values of 140/7590mm/Hg or within value directed by their physician.   Progress seen toward goals     Yes   Comments     BP readings doing well   Abnormal Lipids   Goal  Cholesterol controlled with medications as prescribed, with individualized exercise RX and with personalized  nutrition plan. Value goals: LDL < 70mg , HDL > 40mg . Participant states understanding of desired cholesterol values and following prescriptions.  Taking cholestrol medicine.   Cholesterol controlled with medications as prescribed, with individualized exercise RX and with personalized nutrition plan. Value goals: LDL < 70mg , HDL > 40mg . Participant states understanding of desired cholesterol values and following prescriptions.   Progress seen towards goals     Unknown   Comments     Definitely working on risk factor control with the exercise regimen he is doing in program.     11/25/14 1525 11/25/14 1539         Increase Aerobic Exercise and Physical Activity   Goals Progress/Improvement seen  Yes Yes      Comments Lollie SailsHarry with encouragement will do increments on the Recumbent Elliptical XR6000. With his dementia, Lollie SailsHarry needs direction which we give him. His daughter brings him and drops him off with us and we make sure he does not leave anytime except with his daughter.  I spoke to  Jubal's daughter Ozella Rocks and she plans on taking him to [employer] to exercise with her after he is done with Cardiac Rehab. She put him in Cardiac Rehab just to make sure he would be ok. She would like to have him walk on the treadmill.          Personal Goals Discharge (Final Personal Goals and Risk Factors Review):      Goals and Risk Factor Review - 11/25/14 1539    Increase Aerobic Exercise and Physical Activity   Goals Progress/Improvement seen  Yes   Comments I spoke to Posey's daughter Ozella Rocks and she plans on taking him to [employer] to exercise with her after he is done with Cardiac Rehab. She put him in Cardiac Rehab just to make sure he would be ok. She would like to have him walk on the treadmill.       ITP Comments:     ITP Comments      12/09/14 1325           ITP Comments 30 day review preparation  Continue with ITP          Comments:

## 2014-12-10 NOTE — Addendum Note (Signed)
Addended by: Rudy JewBICE, SUSANNE P on: 12/10/2014 07:45 AM   Modules accepted: Orders

## 2014-12-11 DIAGNOSIS — Z951 Presence of aortocoronary bypass graft: Secondary | ICD-10-CM

## 2014-12-11 NOTE — Progress Notes (Signed)
Discharge Summary  Patient Details  Name: Travis PATTISON Sr. MRN: [id number] Date of Birth: 12/07/1931 Referring Provider:  Lottie Dawson, MD   Number of Visits: 36/36 sessions  Reason for Discharge:  Patient reached a stable level of exercise. Patient independent in their exercise.  Smoking History:  History  Smoking status  . Former Smoker -- 1.50 packs/day for 60 years  . Types: Cigarettes  . Quit date: 11/03/2013  Smokeless tobacco  . Not on file    Diagnosis:  S/P CABG x 1  ADL UCSD:   Initial Exercise Prescription:     Initial Exercise Prescription - 10/02/14 1000    Date of Initial Exercise Prescription   Date 09/23/14   Treadmill   MPH 1.8   Grade 0   Minutes 10   Bike   Level --   Minutes --   Recumbant Bike   Level --   RPM --   Watts --   Minutes --   NuStep   Level 2   Watts 30   Minutes 10  Does better with then 5 minutes   Arm Ergometer   Level 1   Watts 8   Minutes 10   Arm/Foot Ergometer   Level --   Watts --   Minutes --   Cybex   Level 3   RPM 60   Minutes 10   Recumbant Elliptical   Level --   RPM --   Watts --   Minutes --   REL-XR   Level 1   Watts 30   Minutes 15   Prescription Details   Frequency (times per week) 3   Intensity   THRR REST +  30   Ratings of Perceived Exertion 11-15   Progression Continue progressive overload as per policy without signs/symptoms or physical distress.   Resistance Training   Training Prescription Yes   Weight 2   Reps 10-15      Discharge Exercise Prescription (Final Exercise Prescription Changes):     Exercise Prescription Changes - 12/09/14 0900    Exercise Review   Progression No   Response to Exercise   Rating of Perceived Exertion (Exercise) 12   Symptoms no   Comments Completed Travis Webb's post today and reviewed the exercise goals he has completed in this program. Also reviewed his transition to a home exercise or community exercise facility. Due to  Travis Webb's communication limitations we will reach out to his family and/or care providers to get a plan in place for home exercise.    Duration Progress to 30 minutes of continuous aerobic without signs/symptoms of physical distress   Intensity Rest + 30   Progression Continue progressive overload as per policy without signs/symptoms or physical distress.   Resistance Training   Training Prescription Yes   Weight 2   Reps 10-15   Interval Training   Interval Training No   NuStep   Level 5   Watts 55   Minutes 20   Arm Ergometer   Level 2   Watts 10   Minutes 10   Recumbant Elliptical   Level 5   Watts 40   Minutes 15   REL-XR   Level 6   Watts 65   Minutes 15   Home Exercise Plan   Plans to continue exercise at Home  Need to confirm plans with his daughter      Functional Capacity:     6 Minute Walk  09/23/14 0917 12/09/14 0946     6 Minute Walk   Phase Initial Discharge    Distance 1140 feet 1350 feet    Distance % Change  18 %    Walk Time 6 minutes 6 minutes    Resting HR 70 bpm 92 bpm    Resting BP 130/70 mmHg 112/80 mmHg    Max Ex. HR 109 bpm 139 bpm    Max Ex. BP 132/68 mmHg 130/78 mmHg    RPE 15 15    Symptoms No No       Psychological, QOL, Others - Outcomes: PHQ 2/9: Depression screen PHQ 2/9 09/23/2014  Decreased Interest 0  Down, Depressed, Hopeless 1  PHQ - 2 Score 1  Altered sleeping 3  Tired, decreased energy 1  Change in appetite 1  Feeling bad or failure about yourself  0  Trouble concentrating 1  Moving slowly or fidgety/restless 1  Suicidal thoughts 0  PHQ-9 Score 8  Difficult doing work/chores Not difficult at all    Quality of Life:     Quality of Life - 11/25/14 1527    Quality of Life Scores   GLOBAL Post --  Not done due to Travis Webb has dementia.      Personal Goals: Goals established at orientation with interventions provided to work toward goal.     Personal Goals and Risk Factors at Admission - 09/23/14 1027     Personal Goals and Risk Factors on Admission    Weight Management Yes   Intervention Learn and follow the exercise and diet guidelines while in the program. Utilize the nutrition and education classes to help gain knowledge of the diet and exercise expectations in the program  Needs to gain weight   Increase Aerobic Exercise and Physical Activity Yes   Hypertension Yes   Goal Participant will see blood pressure controlled within the values of 140/4590mm/Hg or within value directed by their physician.   Intervention Provide nutrition & aerobic exercise along with prescribed medications to achieve BP 140/90 or less.   Lipids Yes   Goal Cholesterol controlled with medications as prescribed, with individualized exercise RX and with personalized nutrition plan. Value goals: LDL < 70mg , HDL > 40mg . Participant states understanding of desired cholesterol values and following prescriptions.   Intervention Provide nutrition & aerobic exercise along with prescribed medications to achieve LDL 70mg , HDL >40mg .       Personal Goals Discharge:     Goals and Risk Factor Review      09/25/14 1217 10/02/14 1023 10/02/14 1025 10/15/14 1345 11/12/14 0654   Increase Aerobic Exercise and Physical Activity   Goals Progress/Improvement seen  Yes Yes  Yes Yes   Comments Travis Webb has dementia so exercise specialist spent a great deal of time with him explaining and setting him up on the Nustep. Travis Webb was able to exercise on the Nustep plus on the REL XR6000. Travis Webb liked the XR6000.  Travis Webb said "thanks for helping me, you all have helped me a lot". --  Travis Webb will got to [employer] with his daughter when graduates from Cardiac Rehab.  Does well with a lot of encouragement and coaching to complete 10 minute increments of exercise on the Nustep sitting down.  Continues to work on exercise progression without problem   Hypertension   Goal  Participant will see blood pressure controlled within the values of 140/6290mm/Hg  or within value directed by their physician.  Blood pressure today was 112/62 very good.  --  Stable blood pressure for Travis Sails.  Participant will see blood pressure controlled within the values of 140/11mm/Hg or within value directed by their physician.   Progress seen toward goals     Yes   Comments     BP readings doing well   Abnormal Lipids   Goal  Cholesterol controlled with medications as prescribed, with individualized exercise RX and with personalized nutrition plan. Value goals: LDL < , HDL > . Participant states understanding of desired cholesterol values and following prescriptions.  Taking cholestrol medicine.   Cholesterol controlled with medications as prescribed, with individualized exercise RX and with personalized nutrition plan. Value goals: LDL < , HDL > . Participant states understanding of desired cholesterol values and following prescriptions.   Progress seen towards goals     Unknown   Comments     Definitely working on risk factor control with the exercise regimen he is doing in program.     11/25/14 1525 11/25/14 1539 12/11/14 1231       Increase Aerobic Exercise and Physical Activity   Goals Progress/Improvement seen  Yes Yes Yes     Comments Travis Webb with encouragement will do increments on the Recumbent Elliptical XR6000. With his dementia, Travis Webb needs direction which we give him. His daughter brings him and drops him off with [place] and we make sure he does not leave anytime except with his daughter.  I spoke to Travis Webb's daughter Travis Webb and she plans on taking him to [employer] to exercise with her after he is done with Cardiac Rehab. She put him in Cardiac Rehab just to make sure he would be ok. She would like to have him walk on the treadmill.  Travis Webb has really increased his time on the XR6000 Recumbant Elliptical since he started. Still needs alot of encouragement due to his dementia.      Hypertension   Progress seen toward goals   Yes      Comments   Travis Webb's blood pressure upon arrival today was 116/66.     Abnormal Lipids   Progress seen towards goals   Unknown     Comments   Primary Care MD usually follows up on this.         Nutrition & Weight - Outcomes:     Pre Biometrics - 09/23/14 0916    Pre Biometrics   Height 5' 8.6" (1.742 m)   Weight 144 lb (65.318 kg)   Waist Circumference 33.75 inches   Hip Circumference 35 inches   Waist to Hip Ratio 0.96 %   BMI (Calculated) 21.6         Post Biometrics - 12/09/14 0949     Post  Biometrics   Height 5' 8.6" (1.742 m)   Weight 150 lb 1.6 oz (68.085 kg)   Waist Circumference 34.5 inches   Hip Circumference 35 inches   Waist to Hip Ratio 0.99 %   BMI (Calculated) 22.5      Nutrition:     Nutrition Therapy & Goals - 09/23/14 1026    Nutrition Therapy   Diet --  Travis Webb has dementia but his Daughter Travis Webb is intrested in sodium info etc.   Drug/Food Interactions Statins/Certain Fruits   Intervention Plan   Intervention Using nutrition plan and personal goals to gain a healthy nutrition lifestyle. Add exercise as prescribed.      Nutrition Discharge:     Rate Your Plate - 16/10/96 1525    Rate Your Plate Scores   Pre Score --  Not done due to Travis Webb has dementia.   Post Score --  Not done due to Travis Webb has dementia.      Education Questionnaire Score:     Knowledge Questionnaire Score - 11/25/14 1525    Knowledge Questionnaire Score   Pre Score --  Not done due to Travis Webb has dementia.   Post Score --  Not done due to Travis Webb has dementia.      Goals reviewed with patient; copy given to patient.

## 2014-12-11 NOTE — Progress Notes (Signed)
Discharge Summary  Patient Details  Name: Travis Webb Sr. MRN: [id number] Date of Birth: [birth year] Referring Provider:  Lottie Dawson, MD   Number of Visits: 50  Reason for Discharge:  Patient reached a stable level of exercise. Patient independent in their exercise.  Smoking History:  History  Smoking status  . Former Smoker -- 1.50 packs/day for 60 years  . Types: Cigarettes  . Quit date: 11/03/2013  Smokeless tobacco  . Not on file    Diagnosis:  S/P CABG x 1  ADL UCSD:   Initial Exercise Prescription:     Initial Exercise Prescription - 10/02/14 1000    Date of Initial Exercise Prescription   Date 09/23/14   Treadmill   MPH 1.8   Grade 0   Minutes 10   Bike   Level --   Minutes --   Recumbant Bike   Level --   RPM --   Watts --   Minutes --   NuStep   Level 2   Watts 30   Minutes 10  Does better with then 5 minutes   Arm Ergometer   Level 1   Watts 8   Minutes 10   Arm/Foot Ergometer   Level --   Watts --   Minutes --   Cybex   Level 3   RPM 60   Minutes 10   Recumbant Elliptical   Level --   RPM --   Watts --   Minutes --   REL-XR   Level 1   Watts 30   Minutes 15   Prescription Details   Frequency (times per week) 3   Intensity   THRR REST +  30   Ratings of Perceived Exertion 11-15   Progression Continue progressive overload as per policy without signs/symptoms or physical distress.   Resistance Training   Training Prescription Yes   Weight 2   Reps 10-15      Discharge Exercise Prescription (Final Exercise Prescription Changes):     Exercise Prescription Changes - 12/09/14 0900    Exercise Review   Progression No   Response to Exercise   Rating of Perceived Exertion (Exercise) 12   Symptoms no   Comments Completed Travis Webb's post today and reviewed the exercise goals he has completed in this program. Also reviewed his transition to a home exercise or community exercise facility. Due to Travis Webb's  communication limitations we will reach out to his family and/or care providers to get a plan in place for home exercise.    Duration Progress to 30 minutes of continuous aerobic without signs/symptoms of physical distress   Intensity Rest + 30   Progression Continue progressive overload as per policy without signs/symptoms or physical distress.   Resistance Training   Training Prescription Yes   Weight 2   Reps 10-15   Interval Training   Interval Training No   NuStep   Level 5   Watts 55   Minutes 20   Arm Ergometer   Level 2   Watts 10   Minutes 10   Recumbant Elliptical   Level 5   Watts 40   Minutes 15   REL-XR   Level 6   Watts 65   Minutes 15   Home Exercise Plan   Plans to continue exercise at Home  Need to confirm plans with his daughter      Functional Capacity:     6 Minute Walk      09/23/14  9528 12/09/14 0946     6 Minute Walk   Phase Initial Discharge    Distance 1140 feet 1350 feet    Distance % Change  18 %    Walk Time 6 minutes 6 minutes    Resting HR 70 bpm 92 bpm    Resting BP 130/70 mmHg 112/80 mmHg    Max Ex. HR 109 bpm 139 bpm    Max Ex. BP 132/68 mmHg 130/78 mmHg    RPE 15 15    Symptoms No No       Psychological, QOL, Others - Outcomes: PHQ 2/9: Depression screen PHQ 2/9 09/23/2014  Decreased Interest 0  Down, Depressed, Hopeless 1  PHQ - 2 Score 1  Altered sleeping 3  Tired, decreased energy 1  Change in appetite 1  Feeling bad or failure about yourself  0  Trouble concentrating 1  Moving slowly or fidgety/restless 1  Suicidal thoughts 0  PHQ-9 Score 8  Difficult doing work/chores Not difficult at all    Quality of Life:     Quality of Life - 11/25/14 1527    Quality of Life Scores   GLOBAL Post --  Not done due to Travis Webb has dementia.      Personal Goals: Goals established at orientation with interventions provided to work toward goal.     Personal Goals and Risk Factors at Admission - 09/23/14 1027     Personal Goals and Risk Factors on Admission    Weight Management Yes   Intervention Learn and follow the exercise and diet guidelines while in the program. Utilize the nutrition and education classes to help gain knowledge of the diet and exercise expectations in the program  Needs to gain weight   Increase Aerobic Exercise and Physical Activity Yes   Hypertension Yes   Goal Participant will see blood pressure controlled within the values of 140/82mm/Hg or within value directed by their physician.   Intervention Provide nutrition & aerobic exercise along with prescribed medications to achieve BP 140/90 or less.   Lipids Yes   Goal Cholesterol controlled with medications as prescribed, with individualized exercise RX and with personalized nutrition plan. Value goals: LDL < , HDL > . Participant states understanding of desired cholesterol values and following prescriptions.   Intervention Provide nutrition & aerobic exercise along with prescribed medications to achieve LDL 70mg , HDL >40mg .       Personal Goals Discharge:     Goals and Risk Factor Review      09/25/14 1217 10/02/14 1023 10/02/14 1025 10/15/14 1345 11/12/14 0654   Increase Aerobic Exercise and Physical Activity   Goals Progress/Improvement seen  Yes Yes  Yes Yes   Comments Travis Webb has dementia so exercise specialist spent a great deal of time with him explaining and setting him up on the Nustep. Travis Webb was able to exercise on the Nustep plus on the REL XR6000. Travis Webb liked the XR6000.  Travis Webb said "thanks for helping me, you all have helped me a lot". --  Travis Webb will got to [employer] with his daughter when graduates from Cardiac Rehab.  Does well with a lot of encouragement and coaching to complete 10 minute increments of exercise on the Nustep sitting down.  Continues to work on exercise progression without problem   Hypertension   Goal  Participant will see blood pressure controlled within the values of 140/29mm/Hg or  within value directed by their physician.  Blood pressure today was 112/62 very good.  --  Stable  blood pressure for Travis Webb.  Participant will see blood pressure controlled within the values of 140/6690mm/Hg or within value directed by their physician.   Progress seen toward goals     Yes   Comments     BP readings doing well   Abnormal Lipids   Goal  Cholesterol controlled with medications as prescribed, with individualized exercise RX and with personalized nutrition plan. Value goals: LDL < 70mg , HDL > 40mg . Participant states understanding of desired cholesterol values and following prescriptions.  Taking cholestrol medicine.   Cholesterol controlled with medications as prescribed, with individualized exercise RX and with personalized nutrition plan. Value goals: LDL < 70mg , HDL > 40mg . Participant states understanding of desired cholesterol values and following prescriptions.   Progress seen towards goals     Unknown   Comments     Definitely working on risk factor control with the exercise regimen he is doing in program.     11/25/14 1525 11/25/14 1539         Increase Aerobic Exercise and Physical Activity   Goals Progress/Improvement seen  Yes Yes      Comments Travis Webb with encouragement will do increments on the Recumbent Elliptical XR6000. With his dementia, Travis Webb needs direction which we give him. His daughter brings him and drops him off with us and we make sure he does not leave anytime except with his daughter.  I spoke to Travis Webb's daughter Travis Webb and she plans on taking him to [employer] to exercise with her after he is done with Cardiac Rehab. She put him in Cardiac Rehab just to make sure he would be ok. She would like to have him walk on the treadmill.          Nutrition & Weight - Outcomes:     Pre Biometrics - 09/23/14 0916    Pre Biometrics   Height 5' 8.6" (1.742 m)   Weight 144 lb (65.318 kg)   Waist Circumference 33.75 inches   Hip Circumference 35 inches    Waist to Hip Ratio 0.96 %   BMI (Calculated) 21.6         Post Biometrics - 12/09/14 0949     Post  Biometrics   Height 5' 8.6" (1.742 m)   Weight 150 lb 1.6 oz (68.085 kg)   Waist Circumference 34.5 inches   Hip Circumference 35 inches   Waist to Hip Ratio 0.99 %   BMI (Calculated) 22.5      Nutrition:     Nutrition Therapy & Goals - 09/23/14 1026    Nutrition Therapy   Diet --  Travis Webb has dementia but his Daughter Travis Landngela is intrested in sodium info etc.   Drug/Food Interactions Statins/Certain Fruits   Intervention Plan   Intervention Using nutrition plan and personal goals to gain a healthy nutrition lifestyle. Add exercise as prescribed.      Nutrition Discharge:     Rate Your Plate - 08/65/7811/22/16 1525    Rate Your Plate Scores   Pre Score --  Not done due to Travis Webb has dementia.   Post Score --  Not done due to Travis Webb has dementia.      Education Questionnaire Score:     Knowledge Questionnaire Score - 11/25/14 1525    Knowledge Questionnaire Score   Pre Score --  Not done due to Travis Webb has dementia.   Post Score --  Not done due to Travis Webb has dementia.      Goals reviewed with patient; copy given to  patient.

## 2014-12-11 NOTE — Addendum Note (Signed)
Addended by: Virgina OrganENTERKIN, Nisaiah Bechtol on: 12/11/2014 12:41 PM   Modules accepted: Orders

## 2014-12-11 NOTE — Progress Notes (Signed)
Cardiac Individual Treatment Plan  Patient Details  Name: Travis Webb. MRN: [id number] Date of Birth: 08-Nov-1931 Referring Provider:  Lottie Dawson, MD  Initial Encounter Date:  09/23/2014  Visit Diagnosis: S/P CABG x 1  Patient's Home Medications on Admission:  Current outpatient prescriptions:  .  atorvastatin (LIPITOR) 80 MG tablet, Take by mouth., Disp: , Rfl:  .  donepezil (ARICEPT) 5 MG tablet, Take by mouth., Disp: , Rfl:  .  metoprolol tartrate (LOPRESSOR) 25 MG tablet, Take by mouth., Disp: , Rfl:  .  nicotine (RA NICOTINE) 21 mg/24hr patch, Place onto the skin., Disp: , Rfl:  .  nitroGLYCERIN (NITROSTAT) 0.4 MG SL tablet, Place under the tongue., Disp: , Rfl:  .  tamsulosin (FLOMAX) 0.4 MG CAPS capsule, Take by mouth., Disp: , Rfl:   Past Medical History: No past medical history on file.  Tobacco Use: History  Smoking status  . Former Smoker -- 1.50 packs/day for 60 years  . Types: Cigarettes  . Quit date: 11/03/2013  Smokeless tobacco  . Not on file    Labs: Recent Review Flowsheet Data    There is no flowsheet data to display.       Exercise Target Goals:    Exercise Program Goal: Individual exercise prescription set with THRR, safety & activity barriers. Participant demonstrates ability to understand and report RPE using BORG scale, to self-measure pulse accurately, and to acknowledge the importance of the exercise prescription.  Exercise Prescription Goal: Starting with aerobic activity 30 plus minutes a day, 3 days per week for initial exercise prescription. Provide home exercise prescription and guidelines that participant acknowledges understanding prior to discharge.  Activity Barriers & Risk Stratification:     Activity Barriers & Risk Stratification - 09/23/14 1023    Activity Barriers & Risk Stratification   Activity Barriers Other (comment)  Dariusz has dementia but does follow instructions and was able to do 6 minute walk test.     Risk Stratification High      6 Minute Walk:     6 Minute Walk      09/23/14 0917 12/09/14 0946     6 Minute Walk   Phase Initial Discharge    Distance 1140 feet 1350 feet    Distance % Change  18 %    Walk Time 6 minutes 6 minutes    Resting HR 70 bpm 92 bpm    Resting BP 130/70 mmHg 112/80 mmHg    Max Ex. HR 109 bpm 139 bpm    Max Ex. BP 132/68 mmHg 130/78 mmHg    RPE 15 15    Symptoms No No       Initial Exercise Prescription:     Initial Exercise Prescription - 10/02/14 1000    Date of Initial Exercise Prescription   Date 09/23/14   Treadmill   MPH 1.8   Grade 0   Minutes 10   Bike   Level --   Minutes --   Recumbant Bike   Level --   RPM --   Watts --   Minutes --   NuStep   Level 2   Watts 30   Minutes 10  Does better with then 5 minutes   Arm Ergometer   Level 1   Watts 8   Minutes 10   Arm/Foot Ergometer   Level --   Watts --   Minutes --   Cybex   Level 3   RPM 60   Minutes 10  Recumbant Elliptical   Level --   RPM --   Watts --   Minutes --   REL-XR   Level 1   Watts 30   Minutes 15   Prescription Details   Frequency (times per week) 3   Intensity   THRR REST +  30   Ratings of Perceived Exertion 11-15   Progression Continue progressive overload as per policy without signs/symptoms or physical distress.   Resistance Training   Training Prescription Yes   Weight 2   Reps 10-15      Exercise Prescription Changes:     Exercise Prescription Changes      10/02/14 1000 10/02/14 1022 10/07/14 0800 10/13/14 1300 10/14/14 0800   Exercise Review   Progression    Yes Yes   Response to Exercise   Blood Pressure (Admit)    120/80 mmHg 120/80 mmHg   Blood Pressure (Exercise)    128/80 mmHg 128/80 mmHg   Blood Pressure (Exit)    120/70 mmHg 120/70 mmHg   Heart Rate (Admit)    81 bpm 81 bpm   Heart Rate (Exercise)    98 bpm 98 bpm   Heart Rate (Exit)    69 bpm 69 bpm   Rating of Perceived Exertion (Exercise)    13 13    Symptoms    No No   Duration    Progress to 50 minutes of aerobic without signs/symptoms of physical distress Progress to 50 minutes of aerobic without signs/symptoms of physical distress   Intensity    Rest + 30 Rest + 30   Progression    Continue progressive overload as per policy without signs/symptoms or physical distress. Continue progressive overload as per policy without signs/symptoms or physical distress.   Resistance Training   Training Prescription    Yes Yes   Weight    2 2   Reps    10-15 10-15   Interval Training   Interval Training    No No   NuStep   Level 2 2 3 3 4    Watts 45 45 50 50 50   Minutes 10  Does well with , rest then 5 minutes 10  Does well with , rest then 5 minutes 10  Does well with , rest then 5 minutes 20 20   Arm Ergometer   Level    1 1   Watts    10 10   Minutes    10 10     10/21/14 0800 11/04/14 0900 11/11/14 1300 11/13/14 0800 11/20/14 1611   Exercise Review   Progression Yes Yes No Yes No   Response to Exercise   Blood Pressure (Admit)   122/72 mmHg  124/76 mmHg   Blood Pressure (Exercise)   134/68 mmHg  138/74 mmHg   Blood Pressure (Exit)   120/72 mmHg  130/80 mmHg   Heart Rate (Admit)   72 bpm  81 bpm   Heart Rate (Exercise)   110 bpm  107 bpm   Heart Rate (Exit)   70 bpm  86 bpm   Rating of Perceived Exertion (Exercise)   12  12   Symptoms No No no no no   Comments Reviewed individualized exercise prescription and made increases per departmental policy. Exercise increases were discussed with the patient and they were able to perform the new work loads without issue (no signs or symptoms).    I reviewed Einer's exercsie prescription with him and he was able to increase on  the equipment with no signs or symptoms.     Duration Progress to 50 minutes of aerobic without signs/symptoms of physical distress Progress to 50 minutes of aerobic without signs/symptoms of physical distress Progress to 30 minutes of continuous aerobic  without signs/symptoms of physical distress Progress to 30 minutes of continuous aerobic without signs/symptoms of physical distress Progress to 30 minutes of continuous aerobic without signs/symptoms of physical distress   Intensity Rest + 30 Rest + 30 Rest + 30 Rest + 30 Rest + 30   Progression Continue progressive overload as per policy without signs/symptoms or physical distress. Continue progressive overload as per policy without signs/symptoms or physical distress. Continue progressive overload as per policy without signs/symptoms or physical distress. Continue progressive overload as per policy without signs/symptoms or physical distress. Continue progressive overload as per policy without signs/symptoms or physical distress.   Resistance Training   Training Prescription Yes Yes Yes Yes Yes   Weight 2 2 2 2 2    Reps 10-15 10-15 10-15 10-15 10-15   Interval Training   Interval Training No No No No No   NuStep   Level 5 5 5 6 5    Watts 55 55 55 55 55   Minutes 20 20 20 20 20    Arm Ergometer   Level 2 2 2 2 2    Watts 10 10 10 10 10    Minutes 10 10 10 10 10    Recumbant Elliptical   Level  5  =BioStep 5  =BioStep 5  =BioStep 5   Watts  40 40 40 40   Minutes  15 15 15 15    REL-XR   Level    6 6   Watts    65 65   Minutes    15 15     12/09/14 0900           Exercise Review   Progression No       Response to Exercise   Rating of Perceived Exertion (Exercise) 12       Symptoms no       Comments Completed Kinley's post today and reviewed the exercise goals he has completed in this program. Also reviewed his transition to a home exercise or community exercise facility. Due to Levoy's communication limitations we will reach out to his family and/or care providers to get a plan in place for home exercise.        Duration Progress to 30 minutes of continuous aerobic without signs/symptoms of physical distress       Intensity Rest + 30       Progression Continue progressive  overload as per policy without signs/symptoms or physical distress.       Resistance Training   Training Prescription Yes       Weight 2       Reps 10-15       Interval Training   Interval Training No       NuStep   Level 5       Watts 55       Minutes 20       Arm Ergometer   Level 2       Watts 10       Minutes 10       Recumbant Elliptical   Level 5       Watts 40       Minutes 15       REL-XR   Level 6  Watts 65       Minutes 15       Home Exercise Plan   Plans to continue exercise at Home  Need to confirm plans with his daughter          Discharge Exercise Prescription (Final Exercise Prescription Changes):     Exercise Prescription Changes - 12/09/14 0900    Exercise Review   Progression No   Response to Exercise   Rating of Perceived Exertion (Exercise) 12   Symptoms no   Comments Completed Mickey's post today and reviewed the exercise goals he has completed in this program. Also reviewed his transition to a home exercise or community exercise facility. Due to Leron's communication limitations we will reach out to his family and/or care providers to get a plan in place for home exercise.    Duration Progress to 30 minutes of continuous aerobic without signs/symptoms of physical distress   Intensity Rest + 30   Progression Continue progressive overload as per policy without signs/symptoms or physical distress.   Resistance Training   Training Prescription Yes   Weight 2   Reps 10-15   Interval Training   Interval Training No   NuStep   Level 5   Watts 55   Minutes 20   Arm Ergometer   Level 2   Watts 10   Minutes 10   Recumbant Elliptical   Level 5   Watts 40   Minutes 15   REL-XR   Level 6   Watts 65   Minutes 15   Home Exercise Plan   Plans to continue exercise at Home  Need to confirm plans with his daughter      Nutrition:  Target Goals: Understanding of nutrition guidelines, daily intake of sodium 1500mg , cholesterol 200mg ,  calories 30% from fat and 7% or less from saturated fats, daily to have 5 or more servings of fruits and vegetables.  Biometrics:     Pre Biometrics - 09/23/14 0916    Pre Biometrics   Height 5' 8.6" (1.742 m)   Weight 144 lb (65.318 kg)   Waist Circumference 33.75 inches   Hip Circumference 35 inches   Waist to Hip Ratio 0.96 %   BMI (Calculated) 21.6         Post Biometrics - 12/09/14 0949     Post  Biometrics   Height 5' 8.6" (1.742 m)   Weight 150 lb 1.6 oz (68.085 kg)   Waist Circumference 34.5 inches   Hip Circumference 35 inches   Waist to Hip Ratio 0.99 %   BMI (Calculated) 22.5      Nutrition Therapy Plan and Nutrition Goals:     Nutrition Therapy & Goals - 09/23/14 1026    Nutrition Therapy   Diet --  Trimaine has dementia but his Daughter [name] is intrested in sodium info etc.   Drug/Food Interactions Statins/Certain Fruits   Intervention Plan   Intervention Using nutrition plan and personal goals to gain a healthy nutrition lifestyle. Add exercise as prescribed.      Nutrition Discharge: Rate Your Plate Scores:     Rate Your Plate - 69/62/95 1525    Rate Your Plate Scores   Pre Score --  Not done due to Fuller has dementia.   Post Score --  Not done due to Curtis has dementia.      Nutrition Goals Re-Evaluation:     Nutrition Goals Re-Evaluation      09/25/14 1216 10/02/14 1022 11/25/14 1540  12/11/14 1231     Personal Goal #1 Re-Evaluation   Personal Goal #1 Pam left [name] [name] ) daughter a vm since daughter wants to know more informationa about sodium and meet with the dietician. I mentioned 1500 mg daily sodium intake is recommended. Her father could gain weight actually but has a decreased appetitie compared to when he used to work.        Goal Progress Seen Yes  Yes Yes    Comments  Pam has left daughter a vm. I will give her information about sodium.  Jayce and his daughter does not have any further questions about sodium etc.   Kemet's daughter is trying to cook healthy foods for her father who has dementia.        Psychosocial: Target Goals: Acknowledge presence or absence of depression, maximize coping skills, provide positive support system. Participant is able to verbalize types and ability to use techniques and skills needed for reducing stress and depression.  Initial Review & Psychosocial Screening:     Initial Psych Review & Screening - 09/23/14 1028    Initial Review   Current issues with Current Sleep Concerns  Diyari has dementia. His daughter is taking care of him and her Grandchild full time.    Family Dynamics   Good Support System? Yes   Screening Interventions   Interventions Encouraged to exercise;Program counselor consult      Quality of Life Scores:     Quality of Life - 11/25/14 1527    Quality of Life Scores   GLOBAL Post --  Not done due to Joeangel has dementia.      PHQ-9:     Recent Review Flowsheet Data    Depression screen [facility] 2/9 09/23/2014   Decreased Interest 0   Down, Depressed, Hopeless 1   PHQ - 2 Score 1   Altered sleeping 3   Tired, decreased energy 1   Change in appetite 1   Feeling bad or failure about yourself  0   Trouble concentrating 1   Moving slowly or fidgety/restless 1   Suicidal thoughts 0   PHQ-9 Score 8   Difficult doing work/chores Not difficult at all      Psychosocial Evaluation and Intervention:   Psychosocial Re-Evaluation:     Psychosocial Re-Evaluation      10/02/14 1024 10/15/14 1346 11/25/14 1540 12/11/14 1233     Psychosocial Re-Evaluation   Interventions Encouraged to attend Cardiac Rehabilitation for the exercise       Comments --  Daughter given info about Adult Day Care programs Dementia so we make sure his daughter picks him up. She is checking into Adult day care for him-given info about [facility].  I spoke to Odel's daughter Ozella Rocks and she plans on taking him to [employer] to exercise with  her after he is done with Cardiac Rehab. She put him in Cardiac Rehab just to make sure he would be ok. She would like to have him walk on the treadmill.  When you ask Damario how he is doing he practically always says "fine". His daughter reports that she has a lot of help from her family with him and that Angelica is pretty easy going.     Continued Psychosocial Services Needed  Yes         Vocational Rehabilitation: Provide vocational rehab assistance to qualifying candidates.   Vocational Rehab Evaluation & Intervention:     Vocational Rehab - 09/23/14 1025  Initial Vocational Rehab Evaluation & Intervention   Assessment shows need for Vocational Rehabilitation No      Education: Education Goals: Education classes will be provided on a weekly basis, covering required topics. Participant will state understanding/return demonstration of topics presented.  Learning Barriers/Preferences:     Learning Barriers/Preferences - 09/23/14 1024    Learning Barriers/Preferences   Learning Barriers Reading;Hearing   Learning Preferences None      Education Topics: General Nutrition Guidelines/Fats and Fiber: -Group instruction provided by verbal, written material, models and posters to present the general guidelines for heart healthy nutrition. Gives an explanation and review of dietary fats and fiber.          Cardiac Rehab from 12/11/2014 in [facility] Cardiac Rehab   Date  12/09/14   Educator  PI   Instruction Review Code  2- meets goals/outcomes      Controlling Sodium/Reading Food Labels: -Group verbal and written material supporting the discussion of sodium use in heart healthy nutrition. Review and explanation with models, verbal and written materials for utilization of the food label.      Cardiac Rehab from 12/11/2014 in [facility] Cardiac Rehab   Date  10/21/14   Educator  PI   Instruction Review Code  2- meets goals/outcomes      Exercise Physiology & Risk Factors: - Group verbal  and written instruction with models to review the exercise physiology of the cardiovascular system and associated critical values. Details cardiovascular disease risk factors and the goals associated with each risk factor.      Cardiac Rehab from 12/11/2014 in [facility] Cardiac Rehab   Date  11/06/14   Educator  KM   Instruction Review Code  2- meets goals/outcomes      Aerobic Exercise & Resistance Training: - Gives group verbal and written discussion on the health impact of inactivity. On the components of aerobic and resistive training programs and the benefits of this training and how to safely progress through these programs.   Flexibility, Balance, General Exercise Guidelines: - Provides group verbal and written instruction on the benefits of flexibility and balance training programs. Provides general exercise guidelines with specific guidelines to those with heart or lung disease. Demonstration and skill practice provided.      Cardiac Rehab from 12/11/2014 in [facility] Cardiac Rehab   Date  11/18/14   Educator  RM   Instruction Review Code  2- meets goals/outcomes      Stress Management: - Provides group verbal and written instruction about the health risks of elevated stress, cause of high stress, and healthy ways to reduce stress.      Cardiac Rehab from 12/11/2014 in [facility] Cardiac Rehab   Date  11/20/14   Educator  C. Demaryius Imran, RN   Instruction Review Code  2- meets goals/outcomes      Depression: - Provides group verbal and written instruction on the correlation between heart/lung disease and depressed mood, treatment options, and the stigmas associated with seeking treatment.      Cardiac Rehab from 12/11/2014 in [facility] Cardiac Rehab   Date  10/30/14   Educator  CE   Instruction Review Code  2- meets goals/outcomes      Anatomy & Physiology of the Heart: - Group verbal and written instruction and models provide basic cardiac anatomy and physiology, with the coronary electrical and  arterial systems. Review of: AMI, Angina, Valve disease, Heart Failure, Cardiac Arrhythmia, Pacemakers, and the ICD.      Cardiac Rehab from 12/11/2014 in  [facility] Cardiac Rehab   Date  09/30/14   Educator  DW   Instruction Review Code  2- meets goals/outcomes      Cardiac Procedures: - Group verbal and written instruction and models to describe the testing methods done to diagnose heart disease. Reviews the outcomes of the test results. Describes the treatment choices: Medical Management, Angioplasty, or Coronary Bypass Surgery.      [facility]   Date  10/28/14   Educator  DW   Instruction Review Code  2- meets goals/outcomes      Cardiac Medications: - Group verbal and written instruction to review commonly prescribed medications for heart disease. Reviews the medication, class of the drug, and side effects. Includes the steps to properly store meds and maintain the prescription regimen.      [facility]   Date  11/13/14   Educator  C.Dashea Mcmullan, RN   Instruction Review Code  2- meets goals/outcomes      Go Sex-Intimacy & Heart Disease, Get SMART - Goal Setting: - Group verbal and written instruction through game format to discuss heart disease and the return to sexual intimacy. Provides group verbal and written material to discuss and apply goal setting through the application of the S.M.A.R.T. Method.      [facility]   Date  10/28/14   Educator  DW   Instruction Review Code  2- meets goals/outcomes      Other Matters of the Heart: - Provides group verbal, written materials and models to describe Heart Failure, Angina, Valve Disease, and Diabetes in the realm of heart disease. Includes description of the disease process and treatment options available to the cardiac patient.      Card[facility]   Date  12/04/14   Educator  C.  Shawnya Mayor, RN   Instruction Review Code  2- meets goals/outcomes      Exercise & Equipment Safety: - Individual verbal instruction and demonstration of equipment use and safety with use of the equipment.      [facility]   Date  10/02/14 [Discussed exer equipm can use at [employer]]   Educator  C. Tiffani Kadow,RN   Instruction Review Code  1- partially meets, needs review/practice      Infection Prevention: - Provides verbal and written material to individual with discussion of infection control including proper hand washing and proper equipment cleaning during exercise session.      [facility]   Date  09/23/14   Educator  C. Nashika Coker,RN   Instruction Review Code  2- meets goals/outcomes      Falls Prevention: - Provides verbal and written material to individual with discussion of falls prevention and safety.      Cardiac Rehab [facility]11/[facility]   Date  09/23/14   Educator  C. Muhamed Luecke,RN   Instruction Review Code  1- partially meets, needs review/practice      Diabetes: - Individual verbal and written instruction to review signs/symptoms of diabetes, desired ranges of glucose level fasting, after meals and with exercise. Advice that pre and post exercise glucose checks will be done for 3 sessions at entry of program.    Knowledge Questionnaire Score:     Knowledge Questionnaire Score - 11/25/14 1525    Knowledge Questionnaire Score  Pre Score --  Not done due to Javiel has dementia.   Post Score --  Not done due to Henderson has dementia.      Personal Goals and Risk Factors at Admission:     Personal Goals and Risk Factors at Admission - 09/23/14 1027    Personal Goals and Risk Factors on Admission    Weight Management Yes   Intervention Learn and follow the exercise and diet guidelines while in the program. Utilize the nutrition and education classes to help  gain knowledge of the diet and exercise expectations in the program  Needs to gain weight   Increase Aerobic Exercise and Physical Activity Yes   Hypertension Yes   Goal Participant will see blood pressure controlled within the values of 140/8mm/Hg or within value directed by their physician.   Intervention Provide nutrition & aerobic exercise along with prescribed medications to achieve BP 140/90 or less.   Lipids Yes   Goal Cholesterol controlled with medications as prescribed, with individualized exercise RX and with personalized nutrition plan. Value goals: LDL < , HDL > . Participant states understanding of desired cholesterol values and following prescriptions.   Intervention Provide nutrition & aerobic exercise along with prescribed medications to achieve LDL 70mg , HDL >40mg .      Personal Goals and Risk Factors Review:      Goals and Risk Factor Review      09/25/14 1217 10/02/14 1023 10/02/14 1025 10/15/14 1345 11/12/14 0654   Increase Aerobic Exercise and Physical Activity   Goals Progress/Improvement seen  Yes Yes  Yes Yes   Comments Gavin has dementia so exercise specialist spent a great deal of time with him explaining and setting him up on the Nustep. Haynes was able to exercise on the Nustep plus on the REL XR6000. Ardit liked the XR6000.  Kani said "thanks for helping me, you all have helped me a lot". --  Jevan will got to [employer] with his daughter when graduates from Cardiac Rehab.  Does well with a lot of encouragement and coaching to complete 10 minute increments of exercise on the Nustep sitting down.  Continues to work on exercise progression without problem   Hypertension   Goal  Participant will see blood pressure controlled within the values of 140/77mm/Hg or within value directed by their physician.  Blood pressure today was 112/62 very good.  --  Stable blood pressure for [name].  Participant will see blood pressure controlled within the values of  140/23mm/Hg or within value directed by their physician.   Progress seen toward goals     Yes   Comments     BP readings doing well   Abnormal Lipids   Goal  Cholesterol controlled with medications as prescribed, with individualized exercise RX and with personalized nutrition plan. Value goals: LDL < , HDL > . Participant states understanding of desired cholesterol values and following prescriptions.  Taking cholestrol medicine.   Cholesterol controlled with medications as prescribed, with individualized exercise RX and with personalized nutrition plan. Value goals: LDL < , HDL > . Participant states understanding of desired cholesterol values and following prescriptions.   Progress seen towards goals     Unknown   Comments     Definitely working on risk factor control with the exercise regimen he is doing in program.     11/25/14 1525 11/25/14 1539 12/11/14 1231       Increase Aerobic Exercise and Physical Activity   Goals Progress/Improvement seen  Yes Yes  Yes     Comments Attilio with encouragement will do increments on the Recumbent Elliptical XR6000. With his dementia, Tarick needs direction which we give him. His daughter brings him and drops him off with [place] and we make sure he does not leave anytime except with his daughter.  I spoke to Jaxsen's daughter Ozella Rocks and she plans on taking him to [employer] to exercise with her after he is done with Cardiac Rehab. She put him in Cardiac Rehab just to make sure he would be ok. She would like to have him walk on the treadmill.  Terrius has really increased his time on the XR6000 Recumbant Elliptical since he started. Still needs alot of encouragement due to his dementia.      Hypertension   Progress seen toward goals   Yes     Comments   Damon's blood pressure upon arrival today was 116/66.     Abnormal Lipids   Progress seen towards goals   Unknown     Comments   Primary Care MD usually follows up on this.          Personal Goals Discharge (Final Personal Goals and Risk Factors Review):      Goals and Risk Factor Review - 12/11/14 1231    Increase Aerobic Exercise and Physical Activity   Goals Progress/Improvement seen  Yes   Comments Loys has really increased his time on the XR6000 Recumbant Elliptical since he started. Still needs alot of encouragement due to his dementia.    Hypertension   Progress seen toward goals Yes   Comments Jacorey's blood pressure upon arrival today was 116/66.   Abnormal Lipids   Progress seen towards goals Unknown   Comments Primary Care MD usually follows up on this.       ITP Comments:     ITP Comments      12/09/14 1325           ITP Comments 30 day review preparation  Continue with ITP          Comments: Trystan completed 36/36 sessions of Cardiac Rehab today. He will exercise with his daughter at [employer]. At the end of each Cardiac Rehab class we made sure to hand off Onnie to his daughter so he wouldn't roam due to his dementia.

## 2014-12-11 NOTE — Patient Instructions (Signed)
Discharge Instructions  Patient Details  Name: Travis ReekHarry G Gilreath Sr. MRN: [id number] Date of Birth: 23-Jul-1931 Referring Provider:  Lottie Dawsonaranasos, Thomas G, MD   Number of Visits:36  Reason for Discharge:  Patient reached a stable level of exercise. Patient independent in their exercise.  Smoking History:  History  Smoking status  . Former Smoker -- 1.50 packs/day for 60 years  . Types: Cigarettes  . Quit date: 11/03/2013  Smokeless tobacco  . Not on file    Diagnosis:  S/P CABG x 1  Initial Exercise Prescription:     Initial Exercise Prescription - 10/02/14 1000    Date of Initial Exercise Prescription   Date 09/23/14   Treadmill   MPH 1.8   Grade 0   Minutes 10   Bike   Level --   Minutes --   Recumbant Bike   Level --   RPM --   Watts --   Minutes --   NuStep   Level 2   Watts 30   Minutes 10  Does better with 10min then 5 minutes   Arm Ergometer   Level 1   Watts 8   Minutes 10   Arm/Foot Ergometer   Level --   Watts --   Minutes --   Cybex   Level 3   RPM 60   Minutes 10   Recumbant Elliptical   Level --   RPM --   Watts --   Minutes --   REL-XR   Level 1   Watts 30   Minutes 15   Prescription Details   Frequency (times per week) 3   Intensity   THRR REST +  30   Ratings of Perceived Exertion 11-15   Progression Continue progressive overload as per policy without signs/symptoms or physical distress.   Resistance Training   Training Prescription Yes   Weight 2   Reps 10-15      Discharge Exercise Prescription (Final Exercise Prescription Changes):     Exercise Prescription Changes - 12/09/14 0900    Exercise Review   Progression No   Response to Exercise   Rating of Perceived Exertion (Exercise) 12   Symptoms no   Comments Completed Travis Webb's post 6MW today and reviewed the exercise goals he has completed in this program. Also reviewed his transition to a home exercise or community exercise facility. Due to Travis Webb's communication  limitations we will reach out to his family and/or care providers to get a plan in place for home exercise.    Duration Progress to 30 minutes of continuous aerobic without signs/symptoms of physical distress   Intensity Rest + 30   Progression Continue progressive overload as per policy without signs/symptoms or physical distress.   Resistance Training   Training Prescription Yes   Weight 2   Reps 10-15   Interval Training   Interval Training No   NuStep   Level 5   Watts 55   Minutes 20   Arm Ergometer   Level 2   Watts 10   Minutes 10   Recumbant Elliptical   Level 5   Watts 40   Minutes 15   REL-XR   Level 6   Watts 65   Minutes 15   Home Exercise Plan   Plans to continue exercise at Home  Need to confirm plans with his daughter      Functional Capacity:     6 Minute Walk      09/23/14 0917 12/09/14 [phone]  6 Minute Walk   Phase Initial Discharge    Distance 1140 feet 1350 feet    Distance % Change  18 %    Walk Time 6 minutes 6 minutes    Resting HR 70 bpm 92 bpm    Resting BP 130/70 mmHg 112/80 mmHg    Max Ex. HR 109 bpm 139 bpm    Max Ex. BP 132/68 mmHg 130/78 mmHg    RPE 15 15    Symptoms No No       Quality of Life:     Quality of Life - 11/25/14 1527    Quality of Life Scores   GLOBAL Post --  Not done due to Travis Webb has dementia.      Personal Goals: Goals established at orientation with interventions provided to work toward goal.     Personal Goals and Risk Factors at Admission - 09/23/14 1027    Personal Goals and Risk Factors on Admission    Weight Management Yes   Intervention Learn and follow the exercise and diet guidelines while in the program. Utilize the nutrition and education classes to help gain knowledge of the diet and exercise expectations in the program  Needs to gain weight   Increase Aerobic Exercise and Physical Activity Yes   Hypertension Yes   Goal Participant will see blood pressure controlled within the values  of 140/2mm/Hg or within value directed by their physician.   Intervention Provide nutrition & aerobic exercise along with prescribed medications to achieve BP 140/90 or less.   Lipids Yes   Goal Cholesterol controlled with medications as prescribed, with individualized exercise RX and with personalized nutrition plan. Value goals: LDL < , HDL > . Participant states understanding of desired cholesterol values and following prescriptions.   Intervention Provide nutrition & aerobic exercise along with prescribed medications to achieve LDL 70mg , HDL >40mg .       Personal Goals Discharge:     Goals and Risk Factor Review - 11/25/14 1539    Increase Aerobic Exercise and Physical Activity   Goals Progress/Improvement seen  Yes   Comments I spoke to Travis Webb and she plans on taking him to [employer] to exercise with her after he is done with Cardiac Rehab. She put him in Cardiac Rehab just to make sure he would be ok. She would like to have him walk on the treadmill.       Nutrition & Weight - Outcomes:     Pre Biometrics - 09/23/14 0916    Pre Biometrics   Height 5' 8.6" (1.742 m)   Weight 144 lb (65.318 kg)   Waist Circumference 33.75 inches   Hip Circumference 35 inches   Waist to Hip Ratio 0.96 %   BMI (Calculated) 21.6         Post Biometrics - 12/09/14 0949     Post  Biometrics   Height 5' 8.6" (1.742 m)   Weight 150 lb 1.6 oz (68.085 kg)   Waist Circumference 34.5 inches   Hip Circumference 35 inches   Waist to Hip Ratio 0.99 %   BMI (Calculated) 22.5      Nutrition:     Nutrition Therapy & Goals - 09/23/14 1026    Nutrition Therapy   Diet --  Travis Webb has dementia but his Daughter Travis Webb is intrested in sodium info etc.   Drug/Food Interactions Statins/Certain Fruits   Intervention Plan   Intervention Using nutrition plan and personal goals to gain a healthy  nutrition lifestyle. Add exercise as prescribed.      Nutrition  Discharge:     Rate Your Plate - 40/98/11 1525    Rate Your Plate Scores   Pre Score --  Not done due to Travis Webb has dementia.   Post Score --  Not done due to Travis Webb has dementia.      Education Questionnaire Score:     Knowledge Questionnaire Score - 11/25/14 1525    Knowledge Questionnaire Score   Pre Score --  Not done due to Trinidad has dementia.   Post Score --  Not done due to Travis Webb has dementia.      Goals reviewed with patient; copy given to patient.

## 2015-03-20 ENCOUNTER — Encounter: Payer: Self-pay | Admitting: *Deleted

## 2015-03-23 ENCOUNTER — Ambulatory Visit: Payer: Medicare Other | Admitting: *Deleted

## 2015-03-23 ENCOUNTER — Encounter: Payer: Self-pay | Admitting: *Deleted

## 2015-03-23 ENCOUNTER — Encounter
Admission: RE | Disposition: A | Discharge status: Home / Self Care | Payer: Self-pay | Source: Ambulatory Visit | Attending: Gastroenterology

## 2015-03-23 ENCOUNTER — Ambulatory Visit
Admission: RE | Admit: 2015-03-23 | Discharge: 2015-03-23 | Disposition: A | Discharge status: Home / Self Care | Payer: Medicare Other | Source: Ambulatory Visit | Attending: Gastroenterology | Admitting: Gastroenterology

## 2015-03-23 DIAGNOSIS — Z1211 Encounter for screening for malignant neoplasm of colon: Secondary | ICD-10-CM | POA: Insufficient documentation

## 2015-03-23 DIAGNOSIS — I519 Heart disease, unspecified: Secondary | ICD-10-CM | POA: Insufficient documentation

## 2015-03-23 DIAGNOSIS — F1097 Alcohol use, unspecified with alcohol-induced persisting dementia: Secondary | ICD-10-CM | POA: Diagnosis not present

## 2015-03-23 DIAGNOSIS — Z86010 Personal history of colonic polyps: Secondary | ICD-10-CM | POA: Insufficient documentation

## 2015-03-23 DIAGNOSIS — Z87891 Personal history of nicotine dependence: Secondary | ICD-10-CM | POA: Diagnosis not present

## 2015-03-23 DIAGNOSIS — Z9861 Coronary angioplasty status: Secondary | ICD-10-CM | POA: Insufficient documentation

## 2015-03-23 DIAGNOSIS — I10 Essential (primary) hypertension: Secondary | ICD-10-CM | POA: Insufficient documentation

## 2015-03-23 DIAGNOSIS — E785 Hyperlipidemia, unspecified: Secondary | ICD-10-CM | POA: Diagnosis not present

## 2015-03-23 HISTORY — DX: Essential (primary) hypertension: I10

## 2015-03-23 HISTORY — DX: Heart disease, unspecified: I51.9

## 2015-03-23 HISTORY — DX: Hyperlipidemia, unspecified: E78.5

## 2015-03-23 HISTORY — PX: COLONOSCOPY WITH PROPOFOL: SHX5780

## 2015-03-23 SURGERY — COLONOSCOPY WITH PROPOFOL
Anesthesia: General

## 2015-03-23 MED ORDER — SODIUM CHLORIDE 0.9 % IV SOLN
INTRAVENOUS | Status: DC
  Administered 2015-03-23: 1000 mL via INTRAVENOUS

## 2015-03-23 MED ORDER — SODIUM CHLORIDE 0.9 % IV SOLN
INTRAVENOUS | Status: DC
  Administered 2015-03-23: 11:00:00 via INTRAVENOUS

## 2015-03-23 MED ORDER — PROPOFOL 500 MG/50ML IV EMUL
INTRAVENOUS | Status: DC | PRN
  Administered 2015-03-23: 25 ug/kg/min via INTRAVENOUS

## 2015-03-23 NOTE — Anesthesia Preprocedure Evaluation (Signed)
Anesthesia Evaluation  Patient identified by MRN, date of birth, ID band Patient awake    Reviewed: Allergy & Precautions, H&P , NPO status , Patient's Chart, lab work & pertinent test results, reviewed documented beta blocker date and time   History of Anesthesia Complications Negative for: history of anesthetic complications  Airway Mallampati: IV  TM Distance: >3 FB Neck ROM: full    Dental no notable dental hx. (+) Edentulous Upper, Edentulous Lower, Upper Dentures, Lower Dentures   Pulmonary neg pulmonary ROS, former smoker,    Pulmonary exam normal breath sounds clear to auscultation       Cardiovascular Exercise Tolerance: Good hypertension, (-) angina+ CAD, + Past MI (3 times), + Cardiac Stents and + CABG (in July)  Normal cardiovascular exam(-) dysrhythmias (-) Valvular Problems/Murmurs Rhythm:regular Rate:Normal     Neuro/Psych negative neurological ROS  negative psych ROS   GI/Hepatic negative GI ROS, Neg liver ROS,   Endo/Other  negative endocrine ROS  Renal/GU negative Renal ROS  negative genitourinary   Musculoskeletal   Abdominal   Peds  Hematology negative hematology ROS (+)   Anesthesia Other Findings Past Medical History:   Heart disease                                                Hyperlipidemia                                               Hypertension                                                 Reproductive/Obstetrics negative OB ROS                             Anesthesia Physical Anesthesia Plan  ASA: III  Anesthesia Plan: General   Post-op Pain Management:    Induction:   Airway Management Planned:   Additional Equipment:   Intra-op Plan:   Post-operative Plan:   Informed Consent: I have reviewed the patients History and Physical, chart, labs and discussed the procedure including the risks, benefits and alternatives for the proposed anesthesia  with the patient or authorized representative who has indicated his/her understanding and acceptance.   Dental Advisory Given  Plan Discussed with: Anesthesiologist, CRNA and Surgeon  Anesthesia Plan Comments:         Anesthesia Quick Evaluation

## 2015-03-23 NOTE — H&P (Signed)
    Primary Care Physician:  Pcp Not In System Primary Gastroenterologist:  Dr. Bluford Kaufmannh  Pre-Procedure History & Physical: HPI:  Travis ReekHarry G Botz Sr. is a 82 y.o. male is here for an colonoscopy  Past Medical History  Diagnosis Date  . Heart disease   . Hyperlipidemia   . Hypertension     Past Surgical History  Procedure Laterality Date  . Coronary angioplasty      Prior to Admission medications   Medication Sig Start Date End Date Taking? Authorizing Provider  atorvastatin (LIPITOR) 80 MG tablet Take by mouth. 11/29/13 11/29/14  Historical Provider, MD  donepezil (ARICEPT) 5 MG tablet Take by mouth. 04/05/13   Historical Provider, MD  metoprolol tartrate (LOPRESSOR) 25 MG tablet Take by mouth. 11/29/13 11/29/14  Historical Provider, MD  nicotine (RA NICOTINE) 21 mg/24hr patch Place onto the skin. 11/29/13   Historical Provider, MD  nitroGLYCERIN (NITROSTAT) 0.4 MG SL tablet Place under the tongue. 11/29/13 11/29/14  Historical Provider, MD  tamsulosin (FLOMAX) 0.4 MG CAPS capsule Take by mouth. 04/05/13   Historical Provider, MD    Allergies as of 03/11/2015  . (No Known Allergies)    No family history on file.  Social History   Social History  . Marital Status: Married    Spouse Name: N/A  . Number of Children: N/A  . Years of Education: N/A   Occupational History  . Not on file.   Social History Main Topics  . Smoking status: Former Smoker -- 1.50 packs/day for 60 years    Types: Cigarettes    Quit date: 11/03/2013  . Smokeless tobacco: Not on file  . Alcohol Use: 0.0 oz/week    0 Standard drinks or equivalent per week     Comment: History of alcohol use chart reads" alcoholic demential"  . Drug Use: Not on file  . Sexual Activity: Not on file   Other Topics Concern  . Not on file   Social History Narrative    Review of Systems: See HPI, otherwise negative ROS  Physical Exam: There were no vitals taken for this visit. General:   Alert,  pleasant and  cooperative in NAD Head:  Normocephalic and atraumatic. Neck:  Supple; no masses or thyromegaly. Lungs:  Clear throughout to auscultation.    Heart:  Regular rate and rhythm. Abdomen:  Soft, nontender and nondistended. Normal bowel sounds, without guarding, and without rebound.   Neurologic:  Alert and  oriented x4;  grossly normal neurologically.  Impression/Plan: Travis ReekHarry G Postlewait Sr. is here for an colonoscopy to be performed for hx of colon polyps  Risks, benefits, limitations, and alternatives regarding  colonoscopy have been reviewed with the patient.  Questions have been answered.  All parties agreeable.   Travonta Gill, Ezzard StandingPAUL Y, MD  03/23/2015, 10:22 AM

## 2015-03-23 NOTE — Transfer of Care (Signed)
Immediate Anesthesia Transfer of Care Note  Patient: Lesle ReekHarry G Bhola Sr.  Procedure(s) Performed: Procedure(s): COLONOSCOPY WITH PROPOFOL (N/A)  Patient Location: PACU  Anesthesia Type:General  Level of Consciousness: awake, alert  and oriented  Airway & Oxygen Therapy: Patient Spontanous Breathing and Patient connected to nasal cannula oxygen  Post-op Assessment: Report given to RN and Post -op Vital signs reviewed and stable  Post vital signs: Reviewed and stable  Last Vitals:  Filed Vitals:   03/23/15 1041  BP: 128/68  Pulse: 65  Temp: 36.7 C  Resp: 18    Complications: No apparent anesthesia complications

## 2015-03-23 NOTE — Op Note (Signed)
[facility] Gastroenterology Patient Name: Travis Webb Procedure Date: 03/23/2015 11:02 AM MRN: [id number] Account #: [id number] Date of Birth: 01-Feb-1931 Admit Type: Outpatient Age: 82 [name] 4 Gender: Male Note Status: Finalized Procedure:            Colonoscopy Indications:          Personal history of colonic polyps Providers:            Ezzard StandingPaul Y. Bluford Kaufmannh, MD Medicines:            Monitored Anesthesia Care Complications:        No immediate complications. Procedure:            Pre-Anesthesia Assessment:                       - Prior to the procedure, a History and Physical was                        performed, and patient medications, allergies and                        sensitivities were reviewed. The patient's tolerance of                        previous anesthesia was reviewed.                       - The risks and benefits of the procedure and the                        sedation options and risks were discussed with the                        patient. All questions were answered and informed                        consent was obtained.                       - After reviewing the risks and benefits, the patient                        was deemed in satisfactory condition to undergo the                        procedure.                       - Prior to the procedure, a History and Physical was                        performed, and patient medications, allergies and                        sensitivities were reviewed. The patient's tolerance of                        previous anesthesia was reviewed.                       - The risks and benefits of the procedure and the  sedation options and risks were discussed with the                        patient. All questions were answered and informed                        consent was obtained.                       - After reviewing the risks and benefits, the patient      was deemed in satisfactory condition to undergo the                        procedure.                       After obtaining informed consent, the colonoscope was                        passed under direct vision. Throughout the procedure,                        the patient's blood pressure, pulse, and oxygen                        saturations were monitored continuously. The                        Colonoscope was introduced through the anus and                        advanced to the the cecum, identified by appendiceal                        orifice and ileocecal valve. The colonoscopy was                        performed without difficulty. The patient tolerated the                        procedure well. The quality of the bowel preparation                        was good. Findings:      The colon (entire examined portion) appeared normal. Impression:           - The entire examined colon is normal.                       - No specimens collected. Recommendation:       - Discharge patient to home.                       - The findings and recommendations were discussed with                        the patient. Procedure Code(s):    --- Professional ---                       [phone], Colonoscopy, flexible; diagnostic, including  collection of specimen(s) by brushing or washing, when                        performed (separate procedure) Diagnosis Code(s):    --- Professional ---                       Z86.010, Personal history of colonic polyps CPT copyright 2016 American Medical Association. All rights reserved. The codes documented in this report are preliminary and upon coder review may  be revised to meet current compliance requirements. Wallace Cullens, MD 03/23/2015 11:45:32 AM This report has been signed electronically. Number of Addenda: 0 Note Initiated On: 03/23/2015 11:02 AM Scope Withdrawal Time: 0 hours 7 minutes 19 seconds  Total Procedure Duration: 0 hours  14 minutes 4 seconds       [facility]

## 2015-03-24 NOTE — Anesthesia Postprocedure Evaluation (Signed)
Anesthesia Post Note  Patient: Travis Webb.  Procedure(s) Performed: Procedure(s) (LRB): COLONOSCOPY WITH PROPOFOL (N/A)  Patient location during evaluation: Endoscopy Anesthesia Type: General Level of consciousness: awake and alert Pain management: pain level controlled Vital Signs Assessment: post-procedure vital signs reviewed and stable Respiratory status: spontaneous breathing, nonlabored ventilation, respiratory function stable and patient connected to nasal cannula oxygen Cardiovascular status: blood pressure returned to baseline and stable Postop Assessment: no signs of nausea or vomiting Anesthetic complications: no    Last Vitals:  Filed Vitals:   03/23/15 1220 03/23/15 1230  BP: 96/69 106/66  Pulse: 77 74  Temp:    Resp: 28 14    Last Pain:  Filed Vitals:   03/24/15 0715  PainSc: 0-No pain                 Lenard SimmerAndrew Evelia Waskey

## 2015-03-25 ENCOUNTER — Encounter: Payer: Self-pay | Admitting: Gastroenterology

## 2018-12-14 ENCOUNTER — Other Ambulatory Visit: Payer: Self-pay

## 2018-12-14 DIAGNOSIS — Z20822 Contact with and (suspected) exposure to covid-19: Secondary | ICD-10-CM

## 2018-12-15 ENCOUNTER — Telehealth: Payer: Self-pay

## 2018-12-15 LAB — NOVEL CORONAVIRUS, NAA: SARS-CoV-2, NAA: NOT DETECTED

## 2018-12-15 NOTE — Telephone Encounter (Signed)
Daughter called for covid results- advised that results are not back

## 2019-01-17 ENCOUNTER — Emergency Department: Payer: Medicare Other

## 2019-01-17 ENCOUNTER — Other Ambulatory Visit: Payer: Self-pay

## 2019-01-17 DIAGNOSIS — N1831 Chronic kidney disease, stage 3a: Secondary | ICD-10-CM | POA: Diagnosis present

## 2019-01-17 DIAGNOSIS — J9601 Acute respiratory failure with hypoxia: Secondary | ICD-10-CM | POA: Diagnosis present

## 2019-01-17 DIAGNOSIS — N179 Acute kidney failure, unspecified: Secondary | ICD-10-CM | POA: Diagnosis present

## 2019-01-17 DIAGNOSIS — E785 Hyperlipidemia, unspecified: Secondary | ICD-10-CM | POA: Diagnosis present

## 2019-01-17 DIAGNOSIS — F0390 Unspecified dementia without behavioral disturbance: Secondary | ICD-10-CM | POA: Diagnosis present

## 2019-01-17 DIAGNOSIS — J1282 Pneumonia due to coronavirus disease 2019: Secondary | ICD-10-CM | POA: Diagnosis present

## 2019-01-17 DIAGNOSIS — Z87891 Personal history of nicotine dependence: Secondary | ICD-10-CM

## 2019-01-17 DIAGNOSIS — D696 Thrombocytopenia, unspecified: Secondary | ICD-10-CM | POA: Diagnosis present

## 2019-01-17 DIAGNOSIS — I129 Hypertensive chronic kidney disease with stage 1 through stage 4 chronic kidney disease, or unspecified chronic kidney disease: Secondary | ICD-10-CM | POA: Diagnosis present

## 2019-01-17 DIAGNOSIS — R7401 Elevation of levels of liver transaminase levels: Secondary | ICD-10-CM | POA: Diagnosis present

## 2019-01-17 DIAGNOSIS — U071 COVID-19: Principal | ICD-10-CM | POA: Diagnosis present

## 2019-01-17 DIAGNOSIS — R0602 Shortness of breath: Secondary | ICD-10-CM | POA: Diagnosis not present

## 2019-01-17 DIAGNOSIS — E860 Dehydration: Secondary | ICD-10-CM | POA: Diagnosis present

## 2019-01-17 DIAGNOSIS — N400 Enlarged prostate without lower urinary tract symptoms: Secondary | ICD-10-CM | POA: Diagnosis present

## 2019-01-17 LAB — COMPREHENSIVE METABOLIC PANEL
ALT: 41 U/L (ref 0–44)
AST: 60 U/L — ABNORMAL HIGH (ref 15–41)
Albumin: 3.2 g/dL — ABNORMAL LOW (ref 3.5–5.0)
Alkaline Phosphatase: 72 U/L (ref 38–126)
Anion gap: 10 (ref 5–15)
BUN: 29 mg/dL — ABNORMAL HIGH (ref 8–23)
CO2: 23 mmol/L (ref 22–32)
Calcium: 8.4 mg/dL — ABNORMAL LOW (ref 8.9–10.3)
Chloride: 108 mmol/L (ref 98–111)
Creatinine, Ser: 1.35 mg/dL — ABNORMAL HIGH (ref 0.61–1.24)
GFR calc Af Amer: 55 mL/min — ABNORMAL LOW (ref >60)
GFR calc non Af Amer: 48 mL/min — ABNORMAL LOW (ref >60)
Glucose, Bld: 90 mg/dL (ref 70–99)
Potassium: 4.9 mmol/L (ref 3.5–5.1)
Sodium: 141 mmol/L (ref 135–145)
Total Bilirubin: 1.3 mg/dL — ABNORMAL HIGH (ref 0.3–1.2)
Total Protein: 7.4 g/dL (ref 6.5–8.1)

## 2019-01-17 LAB — CBC
HCT: 46.7 % (ref 39.0–52.0)
Hemoglobin: 14.2 g/dL (ref 13.0–17.0)
MCH: 22.9 pg — ABNORMAL LOW (ref 26.0–34.0)
MCHC: 30.4 g/dL (ref 30.0–36.0)
MCV: 75.4 fL — ABNORMAL LOW (ref 80.0–100.0)
Platelets: 58 10*3/uL — ABNORMAL LOW (ref 150–400)
RBC: 6.19 MIL/uL — ABNORMAL HIGH (ref 4.22–5.81)
RDW: 15.9 % — ABNORMAL HIGH (ref 11.5–15.5)
WBC: 3.6 10*3/uL — ABNORMAL LOW (ref 4.0–10.5)
nRBC: 0 % (ref 0.0–0.2)

## 2019-01-17 LAB — TROPONIN I (HIGH SENSITIVITY)
Troponin I (High Sensitivity): 22 ng/L — ABNORMAL HIGH (ref <18)
Troponin I (High Sensitivity): 24 ng/L — ABNORMAL HIGH (ref <18)

## 2019-01-17 NOTE — ED Notes (Signed)
Spoke with pt's daughter Ozella Rocks [phone]) who st family has tested +COVID, pt had test last Thursday and results came Saturday; pt lives alone, normally ambulatory and continent but recently having weakness and no PO intake; pt is not on O2 at home

## 2019-01-17 NOTE — ED Notes (Signed)
First nurse note: pt here via EMS from home with c/o not eating and weakness for the past few days, is covid +, 141/72, 72, PCV's on monitor via EMS,4L [place], bg 160. 500cc given en route. #20 LAC.

## 2019-01-17 NOTE — ED Notes (Signed)
Pt unsure of why he is here states has no complaints. Is oriented to self only. Per report given to first nurse pt is here for possible dehydration and weakness.

## 2019-01-18 ENCOUNTER — Inpatient Hospital Stay
Admission: EM | Admit: 2019-01-18 | Discharge: 2019-01-22 | DRG: 177 | Disposition: A | Discharge status: Home Health | Payer: Medicare Other | Attending: Internal Medicine | Admitting: Internal Medicine

## 2019-01-18 ENCOUNTER — Other Ambulatory Visit: Payer: Self-pay

## 2019-01-18 DIAGNOSIS — I10 Essential (primary) hypertension: Secondary | ICD-10-CM | POA: Diagnosis present

## 2019-01-18 DIAGNOSIS — E785 Hyperlipidemia, unspecified: Secondary | ICD-10-CM | POA: Diagnosis present

## 2019-01-18 DIAGNOSIS — F0390 Unspecified dementia without behavioral disturbance: Secondary | ICD-10-CM | POA: Diagnosis present

## 2019-01-18 DIAGNOSIS — U071 COVID-19: Secondary | ICD-10-CM | POA: Diagnosis not present

## 2019-01-18 DIAGNOSIS — I129 Hypertensive chronic kidney disease with stage 1 through stage 4 chronic kidney disease, or unspecified chronic kidney disease: Secondary | ICD-10-CM | POA: Diagnosis present

## 2019-01-18 DIAGNOSIS — R7401 Elevation of levels of liver transaminase levels: Secondary | ICD-10-CM | POA: Diagnosis present

## 2019-01-18 DIAGNOSIS — N400 Enlarged prostate without lower urinary tract symptoms: Secondary | ICD-10-CM | POA: Diagnosis present

## 2019-01-18 DIAGNOSIS — N179 Acute kidney failure, unspecified: Secondary | ICD-10-CM

## 2019-01-18 DIAGNOSIS — E860 Dehydration: Secondary | ICD-10-CM

## 2019-01-18 DIAGNOSIS — J069 Acute upper respiratory infection, unspecified: Secondary | ICD-10-CM | POA: Diagnosis not present

## 2019-01-18 DIAGNOSIS — D696 Thrombocytopenia, unspecified: Secondary | ICD-10-CM | POA: Diagnosis not present

## 2019-01-18 DIAGNOSIS — R0602 Shortness of breath: Secondary | ICD-10-CM | POA: Diagnosis present

## 2019-01-18 DIAGNOSIS — J9601 Acute respiratory failure with hypoxia: Secondary | ICD-10-CM | POA: Diagnosis present

## 2019-01-18 DIAGNOSIS — Z87891 Personal history of nicotine dependence: Secondary | ICD-10-CM | POA: Diagnosis not present

## 2019-01-18 DIAGNOSIS — J1282 Pneumonia due to coronavirus disease 2019: Secondary | ICD-10-CM | POA: Diagnosis present

## 2019-01-18 DIAGNOSIS — R531 Weakness: Secondary | ICD-10-CM

## 2019-01-18 DIAGNOSIS — N183 Chronic kidney disease, stage 3 unspecified: Secondary | ICD-10-CM | POA: Diagnosis present

## 2019-01-18 DIAGNOSIS — N1831 Chronic kidney disease, stage 3a: Secondary | ICD-10-CM | POA: Diagnosis present

## 2019-01-18 LAB — LACTATE DEHYDROGENASE: LDH: 356 U/L — ABNORMAL HIGH (ref 98–192)

## 2019-01-18 LAB — C-REACTIVE PROTEIN: CRP: 4.1 mg/dL — ABNORMAL HIGH (ref <1.0)

## 2019-01-18 LAB — BRAIN NATRIURETIC PEPTIDE: B Natriuretic Peptide: 748 pg/mL — ABNORMAL HIGH (ref 0.0–100.0)

## 2019-01-18 LAB — TRIGLYCERIDES: Triglycerides: 62 mg/dL (ref <150)

## 2019-01-18 LAB — LACTIC ACID, PLASMA
Lactic Acid, Venous: 2.1 mmol/L (ref 0.5–1.9)
Lactic Acid, Venous: 2.1 mmol/L (ref 0.5–1.9)

## 2019-01-18 LAB — FERRITIN: Ferritin: 4772 ng/mL — ABNORMAL HIGH (ref 24–336)

## 2019-01-18 LAB — POC SARS CORONAVIRUS 2 AG: SARS Coronavirus 2 Ag: POSITIVE — AB

## 2019-01-18 LAB — FIBRIN DERIVATIVES D-DIMER (ARMC ONLY): Fibrin derivatives D-dimer (ARMC): 3307.12 ng/mL (FEU) — ABNORMAL HIGH (ref 0.00–499.00)

## 2019-01-18 LAB — ABO/RH: ABO/RH(D): A POS

## 2019-01-18 LAB — FIBRINOGEN: Fibrinogen: 519 mg/dL — ABNORMAL HIGH (ref 210–475)

## 2019-01-18 LAB — PROCALCITONIN: Procalcitonin: <0.1 ng/mL

## 2019-01-18 MED ORDER — ATORVASTATIN CALCIUM 80 MG PO TABS
80.0000 mg | ORAL_TABLET | Freq: Every day | ORAL | Status: DC
  Administered 2019-01-18 – 2019-01-21 (×4): 80 mg via ORAL
  Filled 2019-01-18: qty 4
  Filled 2019-01-18: qty 1
  Filled 2019-01-18 (×3): qty 4

## 2019-01-18 MED ORDER — SODIUM CHLORIDE 0.9 % IV SOLN
200.0000 mg | Freq: Once | INTRAVENOUS | Status: AC
  Administered 2019-01-18: 200 mg via INTRAVENOUS
  Filled 2019-01-18: qty 200

## 2019-01-18 MED ORDER — ALBUTEROL SULFATE HFA 108 (90 BASE) MCG/ACT IN AERS
2.0000 | INHALATION_SPRAY | RESPIRATORY_TRACT | Status: DC | PRN
  Filled 2019-01-18: qty 6.7

## 2019-01-18 MED ORDER — TAMSULOSIN HCL 0.4 MG PO CAPS
0.4000 mg | ORAL_CAPSULE | Freq: Every day | ORAL | Status: DC
  Administered 2019-01-18 – 2019-01-22 (×5): 0.4 mg via ORAL
  Filled 2019-01-18 (×4): qty 1

## 2019-01-18 MED ORDER — SODIUM CHLORIDE 0.9 % IV BOLUS
1000.0000 mL | Freq: Once | INTRAVENOUS | Status: AC
  Administered 2019-01-18: 1000 mL via INTRAVENOUS

## 2019-01-18 MED ORDER — DM-GUAIFENESIN ER 30-600 MG PO TB12
1.0000 | ORAL_TABLET | Freq: Two times a day (BID) | ORAL | Status: DC
  Administered 2019-01-18 – 2019-01-22 (×9): 1 via ORAL
  Filled 2019-01-18 (×10): qty 1

## 2019-01-18 MED ORDER — DONEPEZIL HCL 23 MG PO TABS: 23.0000 mg | ORAL_TABLET | Freq: Every day | ORAL | Status: DC

## 2019-01-18 MED ORDER — METHYLPREDNISOLONE SODIUM SUCC 40 MG IJ SOLR
40.0000 mg | Freq: Two times a day (BID) | INTRAMUSCULAR | Status: DC
  Administered 2019-01-18 – 2019-01-20 (×5): 40 mg via INTRAVENOUS
  Filled 2019-01-18 (×5): qty 1

## 2019-01-18 MED ORDER — SODIUM CHLORIDE 0.9 % IV SOLN
500.0000 mg | Freq: Once | INTRAVENOUS | Status: AC
  Administered 2019-01-18: 500 mg via INTRAVENOUS
  Filled 2019-01-18: qty 500

## 2019-01-18 MED ORDER — ACETAMINOPHEN 325 MG PO TABS: 650.0000 mg | ORAL_TABLET | Freq: Four times a day (QID) | ORAL | Status: DC | PRN

## 2019-01-18 MED ORDER — ASCORBIC ACID 500 MG PO TABS
500.0000 mg | ORAL_TABLET | Freq: Every day | ORAL | Status: DC
  Administered 2019-01-18 – 2019-01-22 (×5): 500 mg via ORAL
  Filled 2019-01-18 (×5): qty 1

## 2019-01-18 MED ORDER — DEXAMETHASONE SODIUM PHOSPHATE 10 MG/ML IJ SOLN
6.0000 mg | INTRAMUSCULAR | Status: DC
  Administered 2019-01-18: 6 mg via INTRAVENOUS
  Filled 2019-01-18: qty 1

## 2019-01-18 MED ORDER — ADULT MULTIVITAMIN W/MINERALS CH
1.0000 | ORAL_TABLET | Freq: Every day | ORAL | Status: DC
  Administered 2019-01-18 – 2019-01-22 (×5): 1 via ORAL
  Filled 2019-01-18 (×4): qty 1

## 2019-01-18 MED ORDER — IPRATROPIUM BROMIDE HFA 17 MCG/ACT IN AERS
2.0000 | INHALATION_SPRAY | RESPIRATORY_TRACT | Status: DC
  Administered 2019-01-18 – 2019-01-22 (×24): 2 via RESPIRATORY_TRACT
  Filled 2019-01-18 (×2): qty 12.9

## 2019-01-18 MED ORDER — SODIUM CHLORIDE 0.9 % IV SOLN
1.0000 g | Freq: Once | INTRAVENOUS | Status: AC
  Administered 2019-01-18: 02:00:00 1 g via INTRAVENOUS
  Filled 2019-01-18: qty 10

## 2019-01-18 MED ORDER — MEMANTINE HCL 5 MG PO TABS
10.0000 mg | ORAL_TABLET | Freq: Two times a day (BID) | ORAL | Status: DC
  Administered 2019-01-18 – 2019-01-22 (×8): 10 mg via ORAL
  Filled 2019-01-18 (×8): qty 2

## 2019-01-18 MED ORDER — ONDANSETRON HCL 4 MG/2ML IJ SOLN: 4.0000 mg | Freq: Three times a day (TID) | INTRAMUSCULAR | Status: DC | PRN

## 2019-01-18 MED ORDER — FERROUS SULFATE 325 (65 FE) MG PO TABS
325.0000 mg | ORAL_TABLET | Freq: Every day | ORAL | Status: DC
  Administered 2019-01-19 – 2019-01-22 (×4): 325 mg via ORAL
  Filled 2019-01-18 (×4): qty 1

## 2019-01-18 MED ORDER — HYDRALAZINE HCL 50 MG PO TABS: 25.0000 mg | ORAL_TABLET | Freq: Three times a day (TID) | ORAL | Status: DC | PRN

## 2019-01-18 MED ORDER — SODIUM CHLORIDE 0.9 % IV SOLN
100.0000 mg | Freq: Every day | INTRAVENOUS | Status: AC
  Administered 2019-01-19 – 2019-01-22 (×4): 100 mg via INTRAVENOUS
  Filled 2019-01-18 (×5): qty 20

## 2019-01-18 MED ORDER — ZINC SULFATE 220 (50 ZN) MG PO CAPS
220.0000 mg | ORAL_CAPSULE | Freq: Every day | ORAL | Status: DC
  Administered 2019-01-18 – 2019-01-22 (×5): 220 mg via ORAL
  Filled 2019-01-18 (×5): qty 1

## 2019-01-18 NOTE — ED Notes (Signed)
Pt standing up out of bed. Pt instructed to get back in bed for his safety. RN Victorino Dike at bedside and assisted pt with new gown and brief. Pt cleaned up and warm blanket given. Pt back in bed resting at this time.

## 2019-01-18 NOTE — Consult Note (Signed)
PHARMACY - PHYSICIAN COMMUNICATION CRITICAL VALUE ALERT - BLOOD CULTURE IDENTIFICATION (BCID)  ZEPHANIAH LUBRANO Sr. is an 86 y.o. male who presented to [facility] on 01/18/2019 with a chief complaint of weakness.   Assessment:  1 out of 4 growing GPC. Most likely contaminant.   Name of physician (or Provider) Contacted: Dr. Clyde Lundborg  Current antibiotics: None.   Changes to prescribed antibiotics recommended: No change in therapy.  Recommendations accepted by provider  No results found for this or any previous visit.  Ronnald Ramp 01/18/2019  6:40 PM

## 2019-01-18 NOTE — Plan of Care (Signed)
  Problem: Education: Goal: Knowledge of risk factors and measures for prevention of condition will improve Outcome: Progressing   Problem: Coping: Goal: Psychosocial and spiritual needs will be supported Outcome: Progressing   Problem: Respiratory: Goal: Will maintain a patent airway Outcome: Progressing Goal: Complications related to the disease process, condition or treatment will be avoided or minimized Outcome: Progressing   

## 2019-01-18 NOTE — ED Notes (Signed)
Pt's brief checked at this time and is clean/dry. Pt sleeping, NAD noted. Will continue to monitor.

## 2019-01-18 NOTE — ED Provider Notes (Signed)
[facility] Emergency Department Provider Note   ____________________________________________   First MD Initiated Contact with Patient 01/18/19 0113     (approximate)  I have reviewed the triage vital signs and the nursing notes.   HISTORY  Chief Complaint Weakness    HPI Travis Webb. is a 86 y.o. male brought to the ED from home via EMS with a chief complaint of decreased PO intake, generalized weakness, unable to ambulate which is unusual from baseline.  Daughter states whole family is Covid positive.  Patient was tested earlier this week and she received a phone call yesterday regarding his positive status.  Patient arrived to the ED via EMS on 4 L nasal cannula oxygen.  He was able to be brought down to 2 L nasal cannula oxygen with saturations 98%.  Patient complains of generalized malaise.  Denies chest pain, shortness of breath, abdominal pain, nausea or vomiting.      Past Medical History:  Diagnosis Date  . Heart disease   . Hyperlipidemia   . Hypertension     Patient Active Problem List   Diagnosis Date Noted  . COVID-19 01/18/2019    Past Surgical History:  Procedure Laterality Date  . COLONOSCOPY WITH PROPOFOL N/A 03/23/2015   Procedure: COLONOSCOPY WITH PROPOFOL;  Surgeon: Wallace Cullens, MD;  Location: [facility] ENDOSCOPY;  Service: Gastroenterology;  Laterality: N/A;  . CORONARY ANGIOPLASTY      Prior to Admission medications   Medication Sig Start Date End Date Taking? Authorizing Provider  atorvastatin (LIPITOR) 80 MG tablet Take by mouth. 11/29/13 01/18/19 Yes [provider]  donepezil (ARICEPT) 23 MG TABS tablet Take 23 mg by mouth at bedtime. 01/09/19  Yes [provider]  FEROSUL 325 (65 Fe) MG tablet TT BY MOUTH ONCE DAILY FOR ANEMIA 01/09/19  Yes [provider]  memantine (NAMENDA) 10 MG tablet Take 10 mg by mouth 2 (two) times daily. 01/09/19  Yes [provider]  Multiple Vitamin (MULTI-VITAMIN)  tablet Take 1 tablet by mouth daily.    Yes [provider]  tamsulosin (FLOMAX) 0.4 MG CAPS capsule Take 0.4 mg by mouth daily.  04/05/13  Yes [provider]    Allergies Patient has no known allergies.  No family history on file.  Social History Social History   Tobacco Use  . Smoking status: Former Smoker    Packs/day: 1.50    Years: 60.00    Pack years: 90.00    Types: Cigarettes    Quit date: 11/03/2013    Years since quitting: 5.2  Substance Use Topics  . Alcohol use: Yes    Alcohol/week: 0.0 standard drinks    Comment: History of alcohol use chart reads" alcoholic demential"  . Drug use: Not on file    Review of Systems  Constitutional: Positive for generalized malaise.  Positive for decreased appetite.  No fever/chills Eyes: No visual changes. ENT: No sore throat. Cardiovascular: Denies chest pain. Respiratory: Positive for cough.  Denies shortness of breath. Gastrointestinal: No abdominal pain.  No nausea, no vomiting.  No diarrhea.  No constipation. Genitourinary: Negative for dysuria. Musculoskeletal: Negative for back pain. Skin: Negative for rash. Neurological: Negative for headaches, focal weakness or numbness.   ____________________________________________   PHYSICAL EXAM:  VITAL SIGNS: ED Triage Vitals  Enc Vitals Group     BP 01/17/19 1925 137/69     Pulse Rate 01/17/19 1925 78     Resp 01/17/19 1925 20  Temp 01/17/19 1925 98.8 F (37.1 C)     Temp Source 01/17/19 1925 Oral     SpO2 01/17/19 1925 98 %     Weight 01/17/19 1926 160 lb (72.6 kg)     Height --      Head Circumference --      Peak Flow --      Pain Score 01/17/19 1925 0     Pain Loc --      Pain Edu? --      Excl. in GC? --     Constitutional: Alert and oriented.  Elderly appearing and in no acute distress. Eyes: Conjunctivae are normal. PERRL. EOMI. Head: Atraumatic. Nose: No congestion/rhinnorhea. Mouth/Throat: Mucous membranes are moist.   Oropharynx non-erythematous. Neck: No stridor.   Cardiovascular: Normal rate, regular rhythm. Grossly normal heart sounds.  Good peripheral circulation. Respiratory: Normal respiratory effort.  No retractions. Gastrointestinal: Soft and nontender to light or deep palpation. No distention. No abdominal bruits. No CVA tenderness. Musculoskeletal: No lower extremity tenderness nor edema.  No joint effusions. Neurologic:  Normal speech and language. No gross focal neurologic deficits are appreciated. MAEx4. Skin:  Skin is warm, dry and intact. No rash noted.  No petechiae. Psychiatric: Mood and affect are normal. Speech and behavior are normal.  ____________________________________________   LABS (all labs ordered are listed, but only abnormal results are displayed)  Labs Reviewed  CBC - Abnormal; Notable for the following components:      Result Value   WBC 3.6 (*)    RBC 6.19 (*)    MCV 75.4 (*)    MCH 22.9 (*)    RDW 15.9 (*)    Platelets 58 (*)    All other components within normal limits  COMPREHENSIVE METABOLIC PANEL - Abnormal; Notable for the following components:   BUN 29 (*)    Creatinine, Ser 1.35 (*)    Calcium 8.4 (*)    Albumin 3.2 (*)    AST 60 (*)    Total Bilirubin 1.3 (*)    GFR calc non Af Amer 48 (*)    GFR calc Af Amer 55 (*)    All other components within normal limits  FIBRIN DERIVATIVES D-DIMER ([facility] ONLY) - Abnormal; Notable for the following components:   Fibrin derivatives D-dimer [facility]) 6,644.03 (*)    All other components within normal limits  FIBRINOGEN - Abnormal; Notable for the following components:   Fibrinogen 519 (*)    All other components within normal limits  LACTIC ACID, PLASMA - Abnormal; Notable for the following components:   Lactic Acid, Venous 2.1 (*)    All other components within normal limits  POC SARS CORONAVIRUS 2 AG - Abnormal; Notable for the following components:   SARS Coronavirus 2 Ag POSITIVE (*)    All other components  within normal limits  TROPONIN I (HIGH SENSITIVITY) - Abnormal; Notable for the following components:   Troponin I (High Sensitivity) 22 (*)    All other components within normal limits  TROPONIN I (HIGH SENSITIVITY) - Abnormal; Notable for the following components:   Troponin I (High Sensitivity) 24 (*)    All other components within normal limits  CULTURE, BLOOD (ROUTINE X 2)  CULTURE, BLOOD (ROUTINE X 2)  PROCALCITONIN  URINALYSIS, COMPLETE (UACMP) WITH MICROSCOPIC  C-REACTIVE PROTEIN  FERRITIN  LACTATE DEHYDROGENASE  LACTIC ACID, PLASMA  POC SARS CORONAVIRUS 2 AG -  ED  ABO/RH  ABO/RH   ____________________________________________  EKG  ED ECG REPORT I, Travious Vanover  J, the attending physician, personally viewed and interpreted this ECG.   Date: 01/18/2019  EKG Time: 1937  Rate: 69  Rhythm: normal EKG, normal sinus rhythm  Axis: Normal  Intervals:none  ST&T Change: T wave inversion lateral leads New T wave inversion from 04/2012  ____________________________________________  RADIOLOGY  ED MD interpretation: Focal opacity at left lung base  Official radiology report(s): DG Chest 2 View  Result Date: 01/17/2019 CLINICAL DATA:  Weakness EXAM: CHEST - 2 VIEW COMPARISON:  04/20/2012 FINDINGS: Post sternotomy changes. Focal opacity in the left infrahilar lung. No pleural effusion. Normal heart size. Aortic atherosclerosis. No pneumothorax. IMPRESSION: 1. Focal opacity at the left lung base, possible focal pneumonia, though mass lesion is also possible. Imaging follow-up to resolution is recommended Electronically Signed   By: Donavan Foil M.D.   On: 01/17/2019 20:15    ____________________________________________   PROCEDURES  Procedure(s) performed (including Critical Care):  Procedures   ____________________________________________   INITIAL IMPRESSION / ASSESSMENT AND PLAN / ED COURSE  As part of my medical decision making, I reviewed the following data  within the [place] History obtained from family, Nursing notes reviewed and incorporated, Labs reviewed, EKG interpreted, Old chart reviewed, Radiograph reviewed, Discussed with admitting physician and Notes from prior ED visits     DONYE CAMPANELLI Sr. was evaluated in Emergency Department on 01/18/2019 for the symptoms described in the history of present illness. He was evaluated in the context of the global COVID-19 pandemic, which necessitated consideration that the patient might be at risk for infection with the SARS-CoV-2 virus that causes COVID-19. Institutional protocols and algorithms that pertain to the evaluation of patients at risk for COVID-19 are in a state of rapid change based on information released by regulatory bodies including the CDC and federal and state organizations. These policies and algorithms were followed during the patient's care in the ED.    86 year old COVID+ male brought to the ED for decreased oral intake, generalized weakness, unable to ambulate secondary to weakness.  Differential diagnosis includes but is not limited to dehydration, viral infection, pneumonia, UTI, metabolic abnormality, etc.  Unfortunately I am unable to find documentation of patient's positive Covid status.  Will obtain rapid antigen swab.  Initiate IV antibiotics for CAP.  IV fluids for AKI.  Anticipate hospitalization.  Clinical Course as of Jan 17 322  Fri Jan 18, 2019  0204 Covid antigen is POSITIVE   [JS]    Clinical Course User Index [JS] Paulette Blanch, MD     ____________________________________________   FINAL CLINICAL IMPRESSION(S) / ED DIAGNOSES  Final diagnoses:  Generalized weakness  AKI (acute kidney injury) ([place])  COVID-19  Pneumonia due to COVID-19 virus  Dehydration     ED Discharge Orders    None       Note:  This document was prepared using Dragon voice recognition software and may include unintentional dictation errors.   Paulette Blanch,  MD 01/18/19 [phone]

## 2019-01-18 NOTE — H&P (Signed)
History and Physical    Travis Webb JOA:[id number] DOB: August 08, 1931 DOA: 01/18/2019  Referring MD/NP/PA:   PCP: [facility] [employer]   Patient coming from:  The patient is coming from home.  At baseline, pt is independent for most of ADL.        Chief Complaint: SOB  HPI: Travis Seeley. is a 86 y.o. male with medical history significant of hypertension, hyperlipidemia, BPH, CKD stage III, thrombocytopenia, dementia, who presents with generalized weakness and shortness breath.  Per report, patient has been having generalized weakness and shortness of breath for almost a week. No active cough.  Patient has some decreased appetite and poor oral intake.  No active nausea vomiting, diarrhea noted.  Denies abdominal pain or chest pain.  He moves all extremities. Per his daughter, pt is unable to ambulate which is unusual from baseline. Daughter states whole family is Covid positive.  Patient was tested earlier this week and she received a phone call yesterday regarding his positive status.  He has oxygen desaturation to 83% on room air, which improved to 95% on 4 L nasal cannula oxygen.  ED Course: pt was found to have positive Covid Ag test, slightly worsening renal function, WBC 3.6, lactic acid 2.1, 2.1, troponin 22, 24, fibrinogen 519, ferritin 4772, procalcitonin <0.10, LDH 356, D-dimer 3307, temperature normal, blood pressure 113/59, heart rate 58, chest x-ray showed left base focal infiltration.  Patient is admitted to MedSurg bed as inpatient.  Review of Systems:  General: no fevers, chills, no body weight gain, has poor appetite, has fatigue HEENT: no blurry vision, hearing changes or sore throat Respiratory: has dyspnea, no coughing, wheezing CV: no chest pain, no palpitations GI: no nausea, vomiting, abdominal pain, diarrhea, constipation GU: no dysuria, burning on urination, increased urinary frequency, hematuria  Ext: no leg edema Neuro: no unilateral weakness, numbness,  or tingling, no vision change or hearing loss Skin: no rash, no skin tear. MSK: No muscle spasm, no deformity, no limitation of range of movement in spin Heme: No easy bruising.  Travel history: No recent long distant travel.  Allergy: No Known Allergies  Past Medical History:  Diagnosis Date  . Heart disease   . Hyperlipidemia   . Hypertension     Past Surgical History:  Procedure Laterality Date  . COLONOSCOPY WITH PROPOFOL N/A 03/23/2015   Procedure: COLONOSCOPY WITH PROPOFOL;  Surgeon: Wallace Cullens, MD;  Location: [facility] ENDOSCOPY;  Service: Gastroenterology;  Laterality: N/A;  . CORONARY ANGIOPLASTY      Social History:  reports that he quit smoking about 5 years ago. His smoking use included cigarettes. He has a 90.00 pack-year smoking history. He does not have any smokeless tobacco history on file. He reports current alcohol use. No history on file for drug.  Family History: No family history on file.  Could not be reviewed due to dementia.  Prior to Admission medications   Medication Sig Start Date End Date Taking? Authorizing Provider  atorvastatin (LIPITOR) 80 MG tablet Take by mouth. 11/29/13 01/18/19 Yes [provider]  donepezil (ARICEPT) 23 MG TABS tablet Take 23 mg by mouth at bedtime. 01/09/19  Yes [provider]  FEROSUL 325 (65 Fe) MG tablet TT BY MOUTH ONCE DAILY FOR ANEMIA 01/09/19  Yes [provider]  memantine (NAMENDA) 10 MG tablet Take 10 mg by mouth 2 (two) times daily. 01/09/19  Yes [provider]  Multiple Vitamin (MULTI-VITAMIN) tablet Take 1 tablet by mouth daily.  Yes [provider]  tamsulosin (FLOMAX) 0.4 MG CAPS capsule Take 0.4 mg by mouth daily.  04/05/13  Yes [provider]    Physical Exam: Vitals:   01/18/19 1430 01/18/19 1500 01/18/19 1530 01/18/19 1600  BP: 125/74 137/72 126/72 120/82  Pulse: 60 (!) 52 61   Resp: (!) 24 (!) 21 (!) 25 (!) 23  Temp:      TempSrc:      SpO2: 96% 98% 99%     Weight:      Height:       General: Not in acute distress HEENT:       Eyes: PERRL, EOMI, no scleral icterus.       ENT: No discharge from the ears and nose, no pharynx injection, no tonsillar enlargement.        Neck: No JVD, no bruit, no mass felt. Heme: No neck lymph node enlargement. Cardiac: S1/S2, RRR, No murmurs, No gallops or rubs. Respiratory: No rales, wheezing, rhonchi or rubs. GI: Soft, nondistended, nontender, no organomegaly, BS present. GU: No hematuria Ext: No pitting leg edema bilaterally. 2+DP/PT pulse bilaterally. Musculoskeletal: No joint deformities, No joint redness or warmth, no limitation of ROM in spin. Skin: No rashes.  Neuro: Alert, cranial nerves II-XII grossly intact, moves all extremities. Psych: Patient is not psychotic, no suicidal or hemocidal ideation.  Labs on Admission: I have personally reviewed following labs and imaging studies  CBC: Recent Labs  Lab 01/17/19 1934  WBC 3.6*  HGB 14.2  HCT 46.7  MCV 75.4*  PLT 58*   Basic Metabolic Panel: Recent Labs  Lab 01/17/19 1934  NA 141  K 4.9  CL 108  CO2 23  GLUCOSE 90  BUN 29*  CREATININE 1.35*  CALCIUM 8.4*   GFR: Estimated Creatinine Clearance: 38.7 mL/min (A) (by C-G formula based on SCr of 1.35 mg/dL (H)). Liver Function Tests: Recent Labs  Lab 01/17/19 1934  AST 60*  ALT 41  ALKPHOS 72  BILITOT 1.3*  PROT 7.4  ALBUMIN 3.2*   No results for input(s): LIPASE, AMYLASE in the last 168 hours. No results for input(s): AMMONIA in the last 168 hours. Coagulation Profile: No results for input(s): INR, PROTIME in the last 168 hours. Cardiac Enzymes: No results for input(s): CKTOTAL, CKMB, CKMBINDEX, TROPONINI in the last 168 hours. BNP (last 3 results) No results for input(s): PROBNP in the last 8760 hours. HbA1C: No results for input(s): HGBA1C in the last 72 hours. CBG: No results for input(s): GLUCAP in the last 168 hours. Lipid Profile: No results for input(s):  CHOL, HDL, LDLCALC, TRIG, CHOLHDL, LDLDIRECT in the last 72 hours. Thyroid Function Tests: No results for input(s): TSH, T4TOTAL, FREET4, T3FREE, THYROIDAB in the last 72 hours. Anemia Panel: Recent Labs    01/18/19 0132  FERRITIN 4,772*   Urine analysis:    Component Value Date/Time   COLORURINE Yellow 04/19/2012 2133   APPEARANCEUR Clear 04/19/2012 2133   LABSPEC 1.018 04/19/2012 2133   PHURINE 6.0 04/19/2012 2133   GLUCOSEU Negative 04/19/2012 2133   HGBUR Negative 04/19/2012 2133   BILIRUBINUR Negative 04/19/2012 2133   KETONESUR Negative 04/19/2012 2133   PROTEINUR Negative 04/19/2012 2133   NITRITE Negative 04/19/2012 2133   LEUKOCYTESUR Negative 04/19/2012 2133   Sepsis Labs: @LABRCNTIP (procalcitonin:4,lacticidven:4) ) Recent Results (from the past 240 hour(s))  Culture, blood (Routine X 2) w Reflex to ID Panel     Status: None (Preliminary result)   Collection Time: 01/18/19  1:48 AM  Specimen: BLOOD  Result Value Ref Range Status   Specimen Description BLOOD RIGHT ANTECUBITAL  Final   Special Requests   Final    BOTTLES DRAWN AEROBIC AND ANAEROBIC Blood Culture results may not be optimal due to an excessive volume of blood received in culture bottles   Culture   Final    NO GROWTH < 12 HOURS Performed at [facility], [address] [place], [place] [address]    Report Status PENDING  Incomplete  Culture, blood (Routine X 2) w Reflex to ID Panel     Status: None (Preliminary result)   Collection Time: 01/18/19  1:48 AM   Specimen: BLOOD  Result Value Ref Range Status   Specimen Description BLOOD BLOOD RIGHT WRIST  Final   Special Requests   Final    BOTTLES DRAWN AEROBIC AND ANAEROBIC Blood Culture adequate volume   Culture   Final    NO GROWTH < 12 HOURS Performed at [facility], [address] [place], [place] [address]    Report Status PENDING  Incomplete     Radiological Exams on Admission: DG Chest 2 View  Result Date:  01/17/2019 CLINICAL DATA:  Weakness EXAM: CHEST - 2 VIEW COMPARISON:  04/20/2012 FINDINGS: Post sternotomy changes. Focal opacity in the left infrahilar lung. No pleural effusion. Normal heart size. Aortic atherosclerosis. No pneumothorax. IMPRESSION: 1. Focal opacity at the left lung base, possible focal pneumonia, though mass lesion is also possible. Imaging follow-up to resolution is recommended Electronically Signed   By: Jasmine Pang M.D.   On: 01/17/2019 20:15     EKG: Independently reviewed.  Sinus rhythm, QTC 486, LAE, PVC, T wave inversion in V5-V6  Assessment/Plan Principal Problem:   Acute respiratory disease due to COVID-19 virus Active Problems:   Hyperlipidemia   Hypertension   Thrombocytopenia (HCC)   CKD (chronic kidney disease), stage IIIa   Acute respiratory disease due to COVID-19 virus: Chest x-ray showed left base focal infiltration.  Patient needs 4 L nasal cannula oxygen. -will admit to med-surg bed as inpt -Remdesivir per pharm -Solumedrol 40 mg bid -vitamin C, zinc.  -Bronchodilators -PRN Mucinex for cough -f/u Blood culture -Gentle IV fluid:  -D-dimer, BNP,Trop, LFT, CRP, LDH, Procalcitonin, Ferritin, fibinogen, TG, Hep B SAg, HIV ab -Daily CRP, Ferritin, D-dimer, -Will ask the patient to maintain an awake prone position for 16+ hours a day, if possible, with a minimum of 2-3 hours at a time -Will attempt to maintain euvolemia to a net negative fluid status -IF patient deteriorates, will consult PCCM and ID  Hyperlipidemia: -lipitor  HTN:  -hydralazine prn -pt is on flomax  Thrombocytopenia (HCC): this is a chronic issue. Platelet 58. No bleeding -f/u by CBC  CKD (chronic kidney disease), stage IIIa: Baseline creatinine 1.29 04/19/2012.  His creatinine is 1.35, BUN 29.  Slightly worsening. -Follow-up with BMP -Patient received 1 L normal saline bolus     Inpatient status:  # Patient requires inpatient status due to high intensity of service,  high risk for further deterioration and high frequency of surveillance required.  I certify that at the point of admission it is my clinical judgment that the patient will require inpatient hospital care spanning beyond 2 midnights from the point of admission.  . This patient has multiple chronic comorbidities including hypertension, hyperlipidemia, BPH, CKD stage III, thrombocytopenia, dementia . Now patient has presenting with acute respiratory disease due to COVID-19 virus with hypoxia . The initial radiographic and laboratory data are  worrisome because of positive RVP for covid19, left base focal infiltration on chest x-ray.  Thrombocytopenia . Current medical needs: please see my assessment and plan . Predictability of an adverse outcome (risk): Patient has multiple comorbidities as listed above. Now presents with acute respiratory disease due to COVID-19 virus with hypoxia. Patient's presentation is highly complicated.  Patient is at high risk of deteriorating.  Will need to be treated in hospital for at least 2 days.           DVT ppx:SCD Code Status: Full code Family Communication:  Yes, patient's daughter by phone Disposition Plan:  Anticipate discharge back to previous home environment Consults called:  none Admission status: Med-surg bed as inpt     Date of Service 01/18/2019    Lorretta Harp [facility]   If 7PM-7AM, please contact night-coverage [email or web address] Password [facility] 01/18/2019, 4:31 PM

## 2019-01-18 NOTE — ED Notes (Signed)
Pt repositioned back into bed. Pt cleaned and new brief placed. Pt informed to stay in bed and use call bell if he needs assistance. Tele sitter monitor at bedside.

## 2019-01-18 NOTE — ED Notes (Addendum)
Called Pharmacy and they will send up inhaler asap

## 2019-01-18 NOTE — ED Notes (Signed)
Pt given breakfast tray. Pt sitting up eating at this time. 

## 2019-01-19 LAB — COMPREHENSIVE METABOLIC PANEL
ALT: 50 U/L — ABNORMAL HIGH (ref 0–44)
AST: 60 U/L — ABNORMAL HIGH (ref 15–41)
Albumin: 2.7 g/dL — ABNORMAL LOW (ref 3.5–5.0)
Alkaline Phosphatase: 71 U/L (ref 38–126)
Anion gap: 11 (ref 5–15)
BUN: 29 mg/dL — ABNORMAL HIGH (ref 8–23)
CO2: 20 mmol/L — ABNORMAL LOW (ref 22–32)
Calcium: 8.4 mg/dL — ABNORMAL LOW (ref 8.9–10.3)
Chloride: 112 mmol/L — ABNORMAL HIGH (ref 98–111)
Creatinine, Ser: 0.94 mg/dL (ref 0.61–1.24)
GFR calc Af Amer: >60 mL/min (ref >60)
GFR calc non Af Amer: >60 mL/min (ref >60)
Glucose, Bld: 139 mg/dL — ABNORMAL HIGH (ref 70–99)
Potassium: 4.8 mmol/L (ref 3.5–5.1)
Sodium: 143 mmol/L (ref 135–145)
Total Bilirubin: 0.9 mg/dL (ref 0.3–1.2)
Total Protein: 6.6 g/dL (ref 6.5–8.1)

## 2019-01-19 LAB — CBC WITH DIFFERENTIAL/PLATELET
Abs Immature Granulocytes: 0.02 10*3/uL (ref 0.00–0.07)
Basophils Absolute: 0 10*3/uL (ref 0.0–0.1)
Basophils Relative: 0 %
Eosinophils Absolute: 0 10*3/uL (ref 0.0–0.5)
Eosinophils Relative: 0 %
HCT: 45.2 % (ref 39.0–52.0)
Hemoglobin: 13.7 g/dL (ref 13.0–17.0)
Immature Granulocytes: 1 %
Lymphocytes Relative: 25 %
Lymphs Abs: 0.8 10*3/uL (ref 0.7–4.0)
MCH: 22.9 pg — ABNORMAL LOW (ref 26.0–34.0)
MCHC: 30.3 g/dL (ref 30.0–36.0)
MCV: 75.5 fL — ABNORMAL LOW (ref 80.0–100.0)
Monocytes Absolute: 0.2 10*3/uL (ref 0.1–1.0)
Monocytes Relative: 7 %
Neutro Abs: 2.2 10*3/uL (ref 1.7–7.7)
Neutrophils Relative %: 67 %
Platelets: 61 10*3/uL — ABNORMAL LOW (ref 150–400)
RBC: 5.99 MIL/uL — ABNORMAL HIGH (ref 4.22–5.81)
RDW: 15.9 % — ABNORMAL HIGH (ref 11.5–15.5)
Smear Review: NORMAL
WBC: 3.2 10*3/uL — ABNORMAL LOW (ref 4.0–10.5)
nRBC: 0 % (ref 0.0–0.2)

## 2019-01-19 LAB — FERRITIN: Ferritin: 4379 ng/mL — ABNORMAL HIGH (ref 24–336)

## 2019-01-19 LAB — FIBRIN DERIVATIVES D-DIMER (ARMC ONLY): Fibrin derivatives D-dimer (ARMC): 1955.66 ng/mL (FEU) — ABNORMAL HIGH (ref 0.00–499.00)

## 2019-01-19 LAB — HEPATITIS B SURFACE ANTIGEN: Hepatitis B Surface Ag: NONREACTIVE

## 2019-01-19 LAB — MAGNESIUM: Magnesium: 1.8 mg/dL (ref 1.7–2.4)

## 2019-01-19 LAB — C-REACTIVE PROTEIN: CRP: 2.7 mg/dL — ABNORMAL HIGH (ref <1.0)

## 2019-01-19 MED ORDER — DONEPEZIL HCL 5 MG PO TABS
10.0000 mg | ORAL_TABLET | Freq: Every day | ORAL | Status: DC
  Administered 2019-01-19 – 2019-01-21 (×3): 10 mg via ORAL
  Filled 2019-01-19 (×3): qty 2

## 2019-01-19 NOTE — Progress Notes (Addendum)
[facility]  - [place] at [facility]   PATIENT NAME: Travis Webb    MR#:  [id number]  DATE OF BIRTH:  18-Jan-1931  SUBJECTIVE:   Confused, not in distress At baseline has dementia REVIEW OF SYSTEMS:   Review of Systems  Unable to perform ROS: Dementia   Tolerating Diet:yes Tolerating PT:   DRUG ALLERGIES:  No Known Allergies  VITALS:  Blood pressure (!) 130/56, pulse 65, temperature (!) 97.4 F (36.3 C), temperature source Axillary, resp. rate 19, height 5\' 8"  (1.727 m), weight 72.6 kg, SpO2 96 %.  PHYSICAL EXAMINATION:   Physical Exam limited exam due to dementia  GENERAL:  86 y.o.-year-old patient lying in the bed with no acute distress. 86   HEENT: Head atraumatic, normocephalic. Oropharynx and nasopharynx clear.  NECK:  Supple, no jugular venous distention. No thyroid enlargement, no tenderness.  LUNGS: Normal breath sounds bilaterally, no wheezing, rales, rhonchi. No use of accessory muscles of respiration.  CARDIOVASCULAR: S1, S2 normal. No murmurs, rubs, or gallops.  ABDOMEN: Soft, nontender, nondistended. Bowel sounds present. No organomegaly or mass.  EXTREMITIES: No cyanosis, clubbing or edema b/l.    NEUROLOGIC: slide nonfocal. Moves all extremities well. PSYCHIATRIC:  patient is alert-- but confused at baseline due to dementia SKIN: No obvious rash, lesion, or ulcer.   LABORATORY PANEL:  CBC Recent Labs  Lab 01/19/19 0546  WBC 3.2*  HGB 13.7  HCT 45.2  PLT 61*    Chemistries  Recent Labs  Lab 01/19/19 0546  NA 143  K 4.8  CL 112*  CO2 20*  GLUCOSE 139*  BUN 29*  CREATININE 0.94  CALCIUM 8.4*  MG 1.8  AST 60*  ALT 50*  ALKPHOS 71  BILITOT 0.9   Cardiac Enzymes No results for input(s): TROPONINI in the last 168 hours. RADIOLOGY:  DG Chest 2 View  Result Date: 01/17/2019 CLINICAL DATA:  Weakness EXAM: CHEST - 2 VIEW COMPARISON:  04/20/2012 FINDINGS: Post sternotomy changes. Focal opacity in the left infrahilar lung. No  pleural effusion. Normal heart size. Aortic atherosclerosis. No pneumothorax. IMPRESSION: 1. Focal opacity at the left lung base, possible focal pneumonia, though mass lesion is also possible. Imaging follow-up to resolution is recommended Electronically Signed   By: 04/22/2012 M.D.   On: 01/17/2019 20:15   ASSESSMENT AND PLAN:   Travis ESLINGER Sr. is a 86 y.o. male with medical history significant of hypertension, hyperlipidemia, BPH, CKD stage III, thrombocytopenia, dementia, who presents with generalized weakness and shortness breath.  # Acute hypoxic respiratory disease due to COVID-19 virus: - Chest x-ray showed left base focal infiltration.  - Patient needs 4 L nasal cannula oxygen---86% on room air --Remdesivir infusion day 2/5 -Solumedrol 40 mg bid -vitamin C, zinc.  -Bronchodilators -PRN Mucinex for cough -Gentle IV fluid:  -Daily CRP 4.1 - Ferritin 4772--- 4379 -Pro calcitonin less than 0.1 -IF patient deteriorates, will consult PCCM and ID -crawl appears hemodynamically stable  #Hyperlipidemia: -lipitor  #HTN: --not on home meds, BP stable -hydralazine prn  # BPH -pt is on flomax  #Thrombocytopenia [facility]): this is a chronic issue. Platelet 58. No bleeding -f/u by CBC  #CKD (chronic kidney disease), stage IIIa: Baseline creatinine 1.29 04/19/2012.  His creatinine is 1.35, BUN 29.  Slightly worsening. -Follow-up with BMP -Patient received 1 L normal saline bolus  #Dementia chronic continue Aricept and Namenda    Procedures: none Family communication : tried to call 04/21/2012, Tod [place] daughters-- no answer Consults : none  Discharge Disposition : to be determined CODE STATUS: full DVT Prophylaxis :SCD-- patient has chronic thrombocytopenia  TOTAL TIME TAKING CARE OF THIS PATIENT: *35* minutes.  >50% time spent on counselling and coordination of care  Note: This dictation was prepared with Dragon dictation along with smaller phrase technology. Any  transcriptional errors that result from this process are unintentional.  Fritzi Mandes M.D on 01/19/2019 at 2:07 PM    After 6pm go to [email or web address]  [facility]   CC: Primary care physician; Inc, [facility] ServicesPatient ID: Travis PRINS Sr., male   DOB: 05-06-1931, 86 y.o.   MRN: [id number]

## 2019-01-20 LAB — CBC WITH DIFFERENTIAL/PLATELET
Abs Immature Granulocytes: 0.03 10*3/uL (ref 0.00–0.07)
Basophils Absolute: 0 10*3/uL (ref 0.0–0.1)
Basophils Relative: 0 %
Eosinophils Absolute: 0 10*3/uL (ref 0.0–0.5)
Eosinophils Relative: 0 %
HCT: 44.8 % (ref 39.0–52.0)
Hemoglobin: 14.2 g/dL (ref 13.0–17.0)
Immature Granulocytes: 0 %
Lymphocytes Relative: 13 %
Lymphs Abs: 0.9 10*3/uL (ref 0.7–4.0)
MCH: 23.2 pg — ABNORMAL LOW (ref 26.0–34.0)
MCHC: 31.7 g/dL (ref 30.0–36.0)
MCV: 73.3 fL — ABNORMAL LOW (ref 80.0–100.0)
Monocytes Absolute: 0.3 10*3/uL (ref 0.1–1.0)
Monocytes Relative: 5 %
Neutro Abs: 5.7 10*3/uL (ref 1.7–7.7)
Neutrophils Relative %: 82 %
Platelets: 69 10*3/uL — ABNORMAL LOW (ref 150–400)
RBC: 6.11 MIL/uL — ABNORMAL HIGH (ref 4.22–5.81)
RDW: 15.7 % — ABNORMAL HIGH (ref 11.5–15.5)
WBC: 7 10*3/uL (ref 4.0–10.5)
nRBC: 0 % (ref 0.0–0.2)

## 2019-01-20 LAB — COMPREHENSIVE METABOLIC PANEL
ALT: 59 U/L — ABNORMAL HIGH (ref 0–44)
AST: 69 U/L — ABNORMAL HIGH (ref 15–41)
Albumin: 2.9 g/dL — ABNORMAL LOW (ref 3.5–5.0)
Alkaline Phosphatase: 70 U/L (ref 38–126)
Anion gap: 6 (ref 5–15)
BUN: 33 mg/dL — ABNORMAL HIGH (ref 8–23)
CO2: 21 mmol/L — ABNORMAL LOW (ref 22–32)
Calcium: 8.5 mg/dL — ABNORMAL LOW (ref 8.9–10.3)
Chloride: 115 mmol/L — ABNORMAL HIGH (ref 98–111)
Creatinine, Ser: 0.89 mg/dL (ref 0.61–1.24)
GFR calc Af Amer: >60 mL/min (ref >60)
GFR calc non Af Amer: >60 mL/min (ref >60)
Glucose, Bld: 142 mg/dL — ABNORMAL HIGH (ref 70–99)
Potassium: 4.6 mmol/L (ref 3.5–5.1)
Sodium: 142 mmol/L (ref 135–145)
Total Bilirubin: 0.9 mg/dL (ref 0.3–1.2)
Total Protein: 7.1 g/dL (ref 6.5–8.1)

## 2019-01-20 LAB — CULTURE, BLOOD (ROUTINE X 2): Special Requests: ADEQUATE

## 2019-01-20 LAB — FIBRIN DERIVATIVES D-DIMER (ARMC ONLY): Fibrin derivatives D-dimer (ARMC): 1424.12 ng/mL (FEU) — ABNORMAL HIGH (ref 0.00–499.00)

## 2019-01-20 LAB — FERRITIN: Ferritin: 3925 ng/mL — ABNORMAL HIGH (ref 24–336)

## 2019-01-20 LAB — MAGNESIUM: Magnesium: 2 mg/dL (ref 1.7–2.4)

## 2019-01-20 LAB — C-REACTIVE PROTEIN: CRP: 1.7 mg/dL — ABNORMAL HIGH (ref <1.0)

## 2019-01-20 MED ORDER — METHYLPREDNISOLONE SODIUM SUCC 40 MG IJ SOLR
40.0000 mg | Freq: Every day | INTRAMUSCULAR | Status: DC
  Administered 2019-01-21: 40 mg via INTRAVENOUS
  Filled 2019-01-20: qty 1

## 2019-01-20 NOTE — TOC Initial Note (Signed)
Transition of Care (TOC) - Initial/Assessment Note    Patient Details  Name: Travis PAYES Sr. MRN: [id number] Date of Birth: [birth year]  Transition of Care [facility]) CM/SW Contact:    Elliot Gurney [place], [place] Phone Number: 01/20/2019, 2:05 PM  Clinical Narrative:                  KETAN RENZ Webbis a 86 y.o.malewith medical history significant ofhypertension, hyperlipidemia, BPH, CKD stage III, thrombocytopenia, dementia, who presents with generalized weakness and shortness breath. Patient noted to be alone and confused. This social worker spoke to patient's daughters Levada Dy Pettiford. Levada Dy contacted POA Maryann Conners as well during the call. Per Levada Dy, the whole family has been sick with COVID-19. Patient  lives in his own home, with his grandson staying with him at night. Per patient's daughter, the family is currently working on a plan for 24 hour care for patient. In addition, they have now hired [facility] to provide additional care for patient. Patient has no DME at home. Patient's family assist with transportation to medical appointments. They also assist with his ADL's and IADL's.  Patient uses [facility] drug store for his pharmacy needs.  Transitions of care team to continue to follow up with patient and his family regarding discharge needs.   [place], LCSW Clinical Social Work [phone]         Patient Goals and CMS Choice        Expected Discharge Plan and Services   In-house Referral: Clinical Social Work Discharge Planning Services: Mobile Meals   Living arrangements for the past 2 months: Single Family Home                                      Prior Living Arrangements/Services Living arrangements for the past 2 months: Single Family Home Lives with:: Relatives(grandson stays with patient at night)          Need for Family Participation in Patient Care: No (Comment) Care giver support system in place?: Yes  (comment) Current home services: (Family was with [facility] , will now be working with [employer]) Criminal Activity/Legal Involvement Pertinent to Current Situation/Hospitalization: No - Comment as needed  Activities of Daily Living Home Assistive Devices/Equipment: None ADL Screening (condition at time of admission) Patient's cognitive ability adequate to safely complete daily activities?: No Is the patient deaf or have difficulty hearing?: Yes Does the patient have difficulty seeing, even when wearing glasses/contacts?: Yes Does the patient have difficulty concentrating, remembering, or making decisions?: Yes Patient able to express need for assistance with ADLs?: Yes Does the patient have difficulty dressing or bathing?: Yes Independently performs ADLs?: Yes (appropriate for developmental age) Does the patient have difficulty walking or climbing stairs?: No Weakness of Legs: Both Weakness of Arms/Hands: None  Permission Sought/Granted                  Emotional Assessment         Alcohol / Substance Use: Alcohol Use Psych Involvement: No (comment)  Admission diagnosis:  Dehydration [E86.0] Generalized weakness [R53.1] AKI (acute kidney injury) ([place]) [N17.9] Pneumonia due to COVID-19 virus [U07.1, J12.82] Acute respiratory disease due to COVID-19 virus [U07.1, J06.9] COVID-19 [U07.1] Patient Active Problem List   Diagnosis Date Noted  . Acute respiratory disease due to COVID-19 virus 01/18/2019  . CKD (chronic kidney disease), stage IIIa 01/18/2019  .  Hyperlipidemia   . Hypertension   . Thrombocytopenia (HCC)   . AKI (acute kidney injury) [facility])    PCP:  Inc, [facility] Pharmacy:   [facility] - [place], [place] - [address] [place] [address] Phone: [phone] Fax: [phone]  [facility] - [place], [place] - [address] [place] [address] Phone: [phone] Fax:  [phone]     Social Determinants of Health (SDOH) Interventions    Readmission Risk Interventions No flowsheet data found.

## 2019-01-20 NOTE — Evaluation (Signed)
Physical Therapy Evaluation Patient Details Name: Travis BORDONARO Sr. MRN: [id number] DOB: [birth year] Today's Date: 01/20/2019   History of Present Illness  Pt admitted for ARF secondary to covid. HIstory includes HTN, HLD, CKD, and dementia. Pt with complaints of weakness with SOB and confusion.   Clinical Impression  Pt is a pleasant 86 year old male who was admitted for ARF secondary to covid. Pt performs bed mobility, transfers, and ambulation with cga and RW. All mobility performed on RA with O2 sats WNL. Pt demonstrates deficits with strength/endurance/mobility/cognition. Currently confused, but follows commands. Would benefit from skilled PT to address above deficits and promote optimal return to PLOF. Recommend transition to [place] upon discharge from acute hospitalization.     Follow Up Recommendations Home health PT;Supervision/Assistance - 24 hour    Equipment Recommendations  Rolling walker with 5" wheels    Recommendations for Other Services       Precautions / Restrictions Precautions Precautions: Fall Restrictions Weight Bearing Restrictions: No      Mobility  Bed Mobility Overal bed mobility: Needs Assistance Bed Mobility: Supine to Sit     Supine to sit: Min guard     General bed mobility comments: safe technique. Once seated, upright posture noted  Transfers Overall transfer level: Needs assistance Equipment used: Rolling walker (2 wheeled) Transfers: Sit to/from Stand Sit to Stand: Min guard         General transfer comment: pt appeared familiar with RW and able to stand with safe technique  Ambulation/Gait Ambulation/Gait assistance: Min guard Gait Distance (Feet): 15 Feet Assistive device: Rolling walker (2 wheeled) Gait Pattern/deviations: Step-through pattern     General Gait Details: ambulated using RW and safe technique. Able to navigate obstacles in room. Somewhat impulsive.   Stairs            Wheelchair Mobility    Modified  Rankin (Stroke Patients Only)       Balance Overall balance assessment: Needs assistance Sitting-balance support: Feet supported Sitting balance-Leahy Scale: Good     Standing balance support: Bilateral upper extremity supported Standing balance-Leahy Scale: Good                               Pertinent Vitals/Pain Pain Assessment: No/denies pain    Home Living Family/patient expects to be discharged to:: Private residence Living Arrangements: Alone Available Help at Discharge: Family(grandson stays at night. family working on 24/7 care) Type of Home: House         Home Equipment: None Additional Comments: pt is poor historian, most of history obtained from chart. Per family, they are trying to work out 24/7 care to keep pt at home    Prior Function Level of Independence: Independent         Comments: pt is poor historian, per notes, pt generally indep, no DME or falls     Hand Dominance        Extremity/Trunk Assessment   Upper Extremity Assessment Upper Extremity Assessment: Overall WFL for tasks assessed    Lower Extremity Assessment Lower Extremity Assessment: Generalized weakness(B LE grossly 4/5)       Communication   Communication: HOH  Cognition Arousal/Alertness: Awake/alert Behavior During Therapy: WFL for tasks assessed/performed Overall Cognitive Status: Impaired/Different from baseline  General Comments: thinks he's at home      General Comments      Exercises     Assessment/Plan    PT Assessment Patient needs continued PT services  PT Problem List Decreased strength;Decreased balance;Decreased mobility;Decreased knowledge of use of DME       PT Treatment Interventions Gait training;Therapeutic exercise;Balance training;DME instruction    PT Goals (Current goals can be found in the Care Plan section)  Acute Rehab PT Goals Patient Stated Goal: to get stronger PT Goal  Formulation: With patient Time For Goal Achievement: 02/03/19 Potential to Achieve Goals: Good    Frequency Min 2X/week   Barriers to discharge        Co-evaluation               AM-PAC PT "6 Clicks" Mobility  Outcome Measure Help needed turning from your back to your side while in a flat bed without using bedrails?: A Little Help needed moving from lying on your back to sitting on the side of a flat bed without using bedrails?: A Little Help needed moving to and from a bed to a chair (including a wheelchair)?: A Little Help needed standing up from a chair using your arms (e.g., wheelchair or bedside chair)?: A Little Help needed to walk in hospital room?: A Little Help needed climbing 3-5 steps with a railing? : A Little 6 Click Score: 18    End of Session Equipment Utilized During Treatment: Gait belt Activity Tolerance: Patient tolerated treatment well Patient left: in chair;with chair alarm set Nurse Communication: Mobility status PT Visit Diagnosis: Muscle weakness (generalized) (M62.81);Difficulty in walking, not elsewhere classified (R26.2);Unsteadiness on feet (R26.81)    Time: 6269-4854 PT Time Calculation (min) (ACUTE ONLY): 20 min   Charges:   PT Evaluation $PT Eval Low Complexity: 1 Low          Elizabeth Palau, PT, DPT [phone]   Jeric Slagel 01/20/2019, 3:19 PM

## 2019-01-20 NOTE — Progress Notes (Signed)
[place] at [place] NAME: Travis Webb    MR#:  [id number]  DATE OF BIRTH:  Jul 17, 1931  SUBJECTIVE:  More awake and alert Confused, not in distress At baseline has dementia REVIEW OF SYSTEMS:   Review of Systems   Tolerating Diet:yes Tolerating PT: ambulate well per RN  DRUG ALLERGIES:  No Known Allergies  VITALS:  Blood pressure (!) 121/57, pulse 69, temperature 97.7 F (36.5 C), temperature source Axillary, resp. rate 19, height 5\' 8"  (1.727 m), weight 72.6 kg, SpO2 99 %.  PHYSICAL EXAMINATION:   Physical Exam limited exam due to dementia  GENERAL:  86 y.o.-year-old patient lying in the bed with no acute distress. Marland Kitchen   HEENT: Head atraumatic, normocephalic. Oropharynx and nasopharynx clear.  NECK:  Supple, no jugular venous distention. No thyroid enlargement, no tenderness.  LUNGS: Normal breath sounds bilaterally, no wheezing, rales, rhonchi. No use of accessory muscles of respiration.  CARDIOVASCULAR: S1, S2 normal. No murmurs, rubs, or gallops.  ABDOMEN: Soft, nontender, nondistended. Bowel sounds present. No organomegaly or mass.  EXTREMITIES: No cyanosis, clubbing or edema b/l.    NEUROLOGIC:grossly nonfocal. Moves all extremities well. PSYCHIATRIC:  patient is alert-- but confused at baseline due to dementia SKIN: No obvious rash, lesion, or ulcer.   LABORATORY PANEL:  CBC Recent Labs  Lab 01/20/19 0414  WBC 7.0  HGB 14.2  HCT 44.8  PLT 69*    Chemistries  Recent Labs  Lab 01/20/19 0414  NA 142  K 4.6  CL 115*  CO2 21*  GLUCOSE 142*  BUN 33*  CREATININE 0.89  CALCIUM 8.5*  MG 2.0  AST 69*  ALT 59*  ALKPHOS 70  BILITOT 0.9   Cardiac Enzymes No results for input(s): TROPONINI in the last 168 hours. RADIOLOGY:  No results found. ASSESSMENT AND PLAN:   Travis Koppel. is a 86 y.o. male with medical history significant of hypertension, hyperlipidemia, BPH, CKD stage III, thrombocytopenia, dementia,  who presents with generalized weakness and shortness breath.  # Acute hypoxic respiratory disease due to COVID-19 virus: - Chest x-ray showed left base focal infiltration.  - Patient needs 4 L nasal cannula oxygen---86% on room air --Remdesivir infusion day 3/5 -Solumedrol 40 mg qd -vitamin C, zinc.  -Bronchodilators -PRN Mucinex for cough -Daily CRP 4.1--2.7--1.7 - Ferritin 4772--- 9326--7124 -Pro calcitonin less than 0.1 -crawl appears hemodynamically stable, sats >92% on RA  #Hyperlipidemia: -lipitor  #HTN: --not on home meds, BP stable -hydralazine prn  # BPH -pt is on flomax  #Thrombocytopenia [facility]): this is a chronic issue. Platelet 58. No bleeding -f/u by CBC  #CKD (chronic kidney disease), stage IIIa: Baseline creatinine 1.29 04/19/2012.  His creatinine is 1.35, BUN 29.  Slightly worsening. -Follow-up with BMP -Patient received 1 L normal saline bolus  #Dementia chronic continue Aricept and Namenda    Procedures: none Family communication : spoke with Travis Webb Consults : none Discharge Disposition : Home-- dter does not want HH CODE STATUS: full DVT Prophylaxis :SCD-- patient has chronic thrombocytopenia  TOTAL TIME TAKING CARE OF THIS PATIENT: *25* minutes.  >50% time spent on counselling and coordination of care  Note: This dictation was prepared with Dragon dictation along with smaller phrase technology. Any transcriptional errors that result from this process are unintentional.  Fritzi Mandes M.D on 01/20/2019 at 2:16 PM    After 6pm go to [email or web address]  [facility]   CC: Primary care physician; Inc, [facility] ID:  Travis Reek Sr., male   DOB: [birth year], 86 y.o.   MRN: [id number]

## 2019-01-21 LAB — CBC WITH DIFFERENTIAL/PLATELET
Abs Immature Granulocytes: 0.04 10*3/uL (ref 0.00–0.07)
Basophils Absolute: 0 10*3/uL (ref 0.0–0.1)
Basophils Relative: 0 %
Eosinophils Absolute: 0 10*3/uL (ref 0.0–0.5)
Eosinophils Relative: 0 %
HCT: 44.9 % (ref 39.0–52.0)
Hemoglobin: 13.9 g/dL (ref 13.0–17.0)
Immature Granulocytes: 1 %
Lymphocytes Relative: 17 %
Lymphs Abs: 1.3 10*3/uL (ref 0.7–4.0)
MCH: 22.8 pg — ABNORMAL LOW (ref 26.0–34.0)
MCHC: 31 g/dL (ref 30.0–36.0)
MCV: 73.7 fL — ABNORMAL LOW (ref 80.0–100.0)
Monocytes Absolute: 0.6 10*3/uL (ref 0.1–1.0)
Monocytes Relative: 8 %
Neutro Abs: 5.4 10*3/uL (ref 1.7–7.7)
Neutrophils Relative %: 74 %
Platelets: 72 10*3/uL — ABNORMAL LOW (ref 150–400)
RBC: 6.09 MIL/uL — ABNORMAL HIGH (ref 4.22–5.81)
RDW: 15.4 % (ref 11.5–15.5)
Smear Review: DECREASED
WBC: 7.3 10*3/uL (ref 4.0–10.5)
nRBC: 0 % (ref 0.0–0.2)

## 2019-01-21 LAB — COMPREHENSIVE METABOLIC PANEL
ALT: 68 U/L — ABNORMAL HIGH (ref 0–44)
AST: 68 U/L — ABNORMAL HIGH (ref 15–41)
Albumin: 2.7 g/dL — ABNORMAL LOW (ref 3.5–5.0)
Alkaline Phosphatase: 69 U/L (ref 38–126)
Anion gap: 7 (ref 5–15)
BUN: 28 mg/dL — ABNORMAL HIGH (ref 8–23)
CO2: 23 mmol/L (ref 22–32)
Calcium: 8.5 mg/dL — ABNORMAL LOW (ref 8.9–10.3)
Chloride: 112 mmol/L — ABNORMAL HIGH (ref 98–111)
Creatinine, Ser: 1.03 mg/dL (ref 0.61–1.24)
GFR calc Af Amer: >60 mL/min (ref >60)
GFR calc non Af Amer: >60 mL/min (ref >60)
Glucose, Bld: 101 mg/dL — ABNORMAL HIGH (ref 70–99)
Potassium: 4.5 mmol/L (ref 3.5–5.1)
Sodium: 142 mmol/L (ref 135–145)
Total Bilirubin: 0.9 mg/dL (ref 0.3–1.2)
Total Protein: 6.6 g/dL (ref 6.5–8.1)

## 2019-01-21 LAB — MAGNESIUM: Magnesium: 1.8 mg/dL (ref 1.7–2.4)

## 2019-01-21 LAB — FIBRIN DERIVATIVES D-DIMER (ARMC ONLY): Fibrin derivatives D-dimer (ARMC): 1478.08 ng/mL (FEU) — ABNORMAL HIGH (ref 0.00–499.00)

## 2019-01-21 LAB — C-REACTIVE PROTEIN: CRP: 0.8 mg/dL (ref <1.0)

## 2019-01-21 LAB — FERRITIN: Ferritin: 2951 ng/mL — ABNORMAL HIGH (ref 24–336)

## 2019-01-21 MED ORDER — PREDNISONE 20 MG PO TABS
30.0000 mg | ORAL_TABLET | Freq: Every day | ORAL | Status: DC
  Administered 2019-01-22: 08:00:00 30 mg via ORAL
  Filled 2019-01-21: qty 1

## 2019-01-21 MED FILL — Multiple Vitamin Tab: ORAL | Qty: 1 | Status: AC

## 2019-01-21 NOTE — Care Management Important Message (Signed)
Important Message  Patient Details  Name: Travis BUECHE Sr. MRN: [id number] Date of Birth: 1931-02-21   Medicare Important Message Given:  Yes Patient in COVID isolation unit.  IM given to bedside RN to deliver to patient.      Allayne Butcher, RN 01/21/2019, 4:55 PM

## 2019-01-21 NOTE — Progress Notes (Signed)
[facility]  - [place] at [facility]   PATIENT NAME: Travis Webb    MR#:  [id number]  DATE OF BIRTH:  28-Mar-1931  SUBJECTIVE:  More awake and alert Confused, not in distress At baseline has dementia REVIEW OF SYSTEMS:   Review of Systems  Unable to perform ROS: Dementia   Tolerating Diet:yes Tolerating PT: ambulate well per RN  DRUG ALLERGIES:  No Known Allergies  VITALS:  Blood pressure (!) 106/58, pulse 70, temperature 98.5 F (36.9 C), temperature source Oral, resp. rate 19, height 5\' 8"  (1.727 m), weight 72.6 kg, SpO2 96 %.  PHYSICAL EXAMINATION:   Physical Exam limited exam due to dementia  GENERAL:  86 y.o.-year-old patient lying in the bed with no acute distress. 85   HEENT: Head atraumatic, normocephalic. Oropharynx and nasopharynx clear.  NECK:  Supple, no jugular venous distention. No thyroid enlargement, no tenderness.  LUNGS: Normal breath sounds bilaterally, no wheezing, rales, rhonchi. No use of accessory muscles of respiration.  CARDIOVASCULAR: S1, S2 normal. No murmurs, rubs, or gallops.  ABDOMEN: Soft, nontender, nondistended. Bowel sounds present. No organomegaly or mass.  EXTREMITIES: No cyanosis, clubbing or edema b/l.    NEUROLOGIC:grossly nonfocal. Moves all extremities well. PSYCHIATRIC:  patient is alert-- but confused at baseline due to dementia SKIN: No obvious rash, lesion, or ulcer.   LABORATORY PANEL:  CBC Recent Labs  Lab 01/21/19 0637  WBC 7.3  HGB 13.9  HCT 44.9  PLT 72*    Chemistries  Recent Labs  Lab 01/21/19 0637  NA 142  K 4.5  CL 112*  CO2 23  GLUCOSE 101*  BUN 28*  CREATININE 1.03  CALCIUM 8.5*  MG 1.8  AST 68*  ALT 68*  ALKPHOS 69  BILITOT 0.9   Cardiac Enzymes No results for input(s): TROPONINI in the last 168 hours. RADIOLOGY:  No results found. ASSESSMENT AND PLAN:   Travis Holland. is a 86 y.o. male with medical history significant of hypertension, hyperlipidemia, BPH, CKD stage III,  thrombocytopenia, dementia, who presents with generalized weakness and shortness breath.  # Acute hypoxic respiratory disease due to COVID-19 virus: - Chest x-ray showed left base focal infiltration.  - Patient came in with requiring 4 L nasal cannula oxygen---86% on room air--now on RA --Remdesivir infusion day 4/5 -Solumedrol 40 mg qd--change to oral taper -vitamin C, zinc.  -Bronchodilators -PRN Mucinex for cough -Daily CRP 4.1--2.7--1.7--0.86 - Ferritin 4772--- 83 -Pro calcitonin less than 0.1 - appears hemodynamically stable, sats >92% on RA  #Hyperlipidemia: -lipitor  #HTN: --not on home meds, BP stable -hydralazine prn  # BPH -pt is on flomax  #Thrombocytopenia [facility]): this is a chronic issue. Platelet 58. No bleeding -f/u by CBC  #CKD (chronic kidney disease), stage IIIa: Baseline creatinine 1.29 04/19/2012.  His creatinine is 1.35, BUN 29.  -creat 1.03 -received IVF  #Dementia chronic continue Aricept and Namenda  anticipate d/c in am  Procedures: none Family communication : spoke with 04/21/2012 Consults : none Discharge Disposition : Home-- dter does not want HH CODE STATUS: full DVT Prophylaxis :SCD-- patient has chronic thrombocytopenia  TOTAL TIME TAKING CARE OF THIS PATIENT: *25* minutes.  >50% time spent on counselling and coordination of care  Note: This dictation was prepared with Dragon dictation along with smaller phrase technology. Any transcriptional errors that result from this process are unintentional.  Travis Webb M.D on 01/21/2019 at 3:51 PM    After 6pm go to [email or web address]  [facility]  CC: Primary care physician; Inc, [facility] ServicesPatient ID: Travis STATON Sr., male   DOB: 1931-06-29, 86 y.o.   MRN: [id number]

## 2019-01-21 NOTE — Progress Notes (Signed)
Daily update given to daughter. 

## 2019-01-22 LAB — MAGNESIUM: Magnesium: 1.7 mg/dL (ref 1.7–2.4)

## 2019-01-22 LAB — FIBRIN DERIVATIVES D-DIMER (ARMC ONLY): Fibrin derivatives D-dimer (ARMC): 1531.89 ng/mL (FEU) — ABNORMAL HIGH (ref 0.00–499.00)

## 2019-01-22 MED ORDER — ALBUTEROL SULFATE HFA 108 (90 BASE) MCG/ACT IN AERS: 2.0000 | INHALATION_SPRAY | RESPIRATORY_TRACT | 0 refills | Status: AC | PRN

## 2019-01-22 MED ORDER — PREDNISONE 10 MG PO TABS: 30.0000 mg | ORAL_TABLET | Freq: Every day | ORAL | 0 refills | Status: AC

## 2019-01-22 NOTE — Progress Notes (Signed)
Daily update given to daughter. 

## 2019-01-22 NOTE — Discharge Summary (Signed)
[facility] NAME: Nhat Hearne    MR#:  [id number]  DATE OF BIRTH:  Jan 27, 1931  DATE OF ADMISSION:  01/18/2019 ADMITTING PHYSICIAN: Ivor Costa, MD  DATE OF DISCHARGE: 01/22/2019  PRIMARY CARE PHYSICIAN: [facility] [place]    ADMISSION DIAGNOSIS:  Dehydration [E86.0] Generalized weakness [R53.1] AKI (acute kidney injury) ([place]) [N17.9] Pneumonia due to COVID-19 virus [U07.1, J12.82] Acute respiratory disease due to COVID-19 virus [U07.1, J06.9] COVID-19 [U07.1]  DISCHARGE DIAGNOSIS:  acute hypoxic respiratory failure due to COVID pneumonia  SECONDARY DIAGNOSIS:   Past Medical History:  Diagnosis Date  . Heart disease   . Hyperlipidemia   . Hypertension     HOSPITAL COURSE:   KESLER WICKHAM Sr.is a 86 y.o.malewith medical history significant ofhypertension, hyperlipidemia, BPH, CKD stage III, thrombocytopenia, dementia, who presents with generalized weakness and shortness breath.  # Acute hypoxic respiratory disease due to COVID-19 virus: -Chest x-ray showed left base focal infiltration.  -Patient came in with requiring4 L nasal cannula oxygen---86% on room air--now on RA --Remdesivir infusion day 5/5 -Solumedrol40 mg qd--change to oral taper -vitamin C, zinc.  -Bronchodilators -PRN Mucinex for cough -Daily CRP 4.1--2.7--1.7--0.86 - Ferritin 4772--- 0865--7846 -Pro calcitonin less than 0.1 - appears hemodynamically stable, sats >92% on RA  # Mild transaminitis  suspected due to COVID infection  #Hyperlipidemia: -lipitor  #HTN: --not on home meds, BP stable -hydralazine prn  # BPH -pt is on flomax  #Thrombocytopenia (HCC):this is a chronic issue. Platelet 58. No bleeding  #CKD (chronic kidney disease), stage IIIa:Baseline creatinine 1.29 04/19/2012. His creatinine is 1.35, BUN 29.  -creat 1.03 -received IVF  #Dementia chronic continue Aricept and Namenda  discharge to home with  home health  Procedures: none Family communication : patient's daughter Levada Dy aware Consults : none Discharge Disposition : Home with HH CODE STATUS: full DVT Prophylaxis :SCD-- patient has chronic thrombocytopenia  CONSULTS OBTAINED:    DRUG ALLERGIES:  No Known Allergies  DISCHARGE MEDICATIONS:   Allergies as of 01/22/2019   No Known Allergies     Medication List    TAKE these medications   albuterol 108 (90 Base) MCG/ACT inhaler Commonly known as: VENTOLIN HFA Inhale 2 puffs into the lungs every 4 (four) hours as needed for wheezing or shortness of breath.   atorvastatin 80 MG tablet Commonly known as: LIPITOR Take by mouth.   donepezil 23 MG Tabs tablet Commonly known as: ARICEPT Take 23 mg by mouth at bedtime.   FeroSul 325 (65 FE) MG tablet Generic drug: ferrous sulfate TT BY MOUTH ONCE DAILY FOR ANEMIA   memantine 10 MG tablet Commonly known as: NAMENDA Take 10 mg by mouth 2 (two) times daily.   Multi-Vitamin tablet Take 1 tablet by mouth daily.   predniSONE 10 MG tablet Commonly known as: DELTASONE Take 3 tablets (30 mg total) by mouth daily with breakfast. Take 30 mg taper by 10 mg then stop Start taking on: January 23, 2019   tamsulosin 0.4 MG Caps capsule Commonly known as: FLOMAX Take 0.4 mg by mouth daily.            Durable Medical Equipment  (From admission, onward)         Start     Ordered   01/22/19 1051  For home use only DME Walker rolling  Once    Question Answer Comment  Walker: With 5 Inch Wheels   Patient needs a walker to treat with the  following condition COVID-19      01/22/19 1051          If you experience worsening of your admission symptoms, develop shortness of breath, life threatening emergency, suicidal or homicidal thoughts you must seek medical attention immediately by calling 911 or calling your MD immediately  if symptoms less severe.  You Must read complete instructions/literature along with all  the possible adverse reactions/side effects for all the Medicines you take and that have been prescribed to you. Take any new Medicines after you have completely understood and accept all the possible adverse reactions/side effects.   Please note  You were cared for by a hospitalist during your hospital stay. If you have any questions about your discharge medications or the care you received while you were in the hospital after you are discharged, you can call the unit and asked to speak with the hospitalist on call if the hospitalist that took care of you is not available. Once you are discharged, your primary care physician will handle any further medical issues. Please note that NO REFILLS for any discharge medications will be authorized once you are discharged, as it is imperative that you return to your primary care physician (or establish a relationship with a primary care physician if you do not have one) for your aftercare needs so that they can reassess your need for medications and monitor your lab values. Today   SUBJECTIVE   Resting in the recliner. Confused at baseline. No respiratory distress  VITAL SIGNS:  Blood pressure 116/72, pulse 73, temperature 97.7 F (36.5 C), temperature source Oral, resp. rate 18, height 5\' 8"  (1.727 m), weight 72.6 kg, SpO2 97 %.  I/O:    Intake/Output Summary (Last 24 hours) at 01/22/2019 1209 Last data filed at 01/22/2019 0820 Gross per 24 hour  Intake --  Output 300 ml  Net -300 ml    PHYSICAL EXAMINATION:  GENERAL:  86 y.o.-year-old patient lying in the bed with no acute distress. thin EYES: Pupils equal, round, reactive to light and accommodation. No scleral icterus.  HEENT: Head atraumatic, normocephalic. Oropharynx and nasopharynx clear.  NECK:  Supple, no jugular venous distention. No thyroid enlargement, no tenderness.  LUNGS: Normal breath sounds bilaterally, no wheezing, rales,rhonchi or crepitation. No use of accessory muscles of  respiration.  CARDIOVASCULAR: S1, S2 normal. No murmurs, rubs, or gallops.  ABDOMEN: Soft, non-tender, non-distended. Bowel sounds present. No organomegaly or mass.  EXTREMITIES: No pedal edema, cyanosis, or clubbing.  NEUROLOGIC: grossly nonfocal PSYCHIATRIC: patient is alert and awake. Has dementia at baseline  SKIN: No obvious rash, lesion, or ulcer.   DATA REVIEW:   CBC  Recent Labs  Lab 01/21/19 0637  WBC 7.3  HGB 13.9  HCT 44.9  PLT 72*    Chemistries  Recent Labs  Lab 01/21/19 0637 01/21/19 0637 01/22/19 0255  NA 142  --   --   K 4.5  --   --   CL 112*  --   --   CO2 23  --   --   GLUCOSE 101*  --   --   BUN 28*  --   --   CREATININE 1.03  --   --   CALCIUM 8.5*  --   --   MG 1.8   < > 1.7  AST 68*  --   --   ALT 68*  --   --   ALKPHOS 69  --   --   BILITOT 0.9  --   --    < > =  values in this interval not displayed.    Microbiology Results   Recent Results (from the past 240 hour(s))  Culture, blood (Routine X 2) w Reflex to ID Panel     Status: None (Preliminary result)   Collection Time: 01/18/19  1:48 AM   Specimen: BLOOD  Result Value Ref Range Status   Specimen Description BLOOD RIGHT ANTECUBITAL  Final   Special Requests   Final    BOTTLES DRAWN AEROBIC AND ANAEROBIC Blood Culture results may not be optimal due to an excessive volume of blood received in culture bottles   Culture   Final    NO GROWTH 4 DAYS Performed at [facility], [address], [place], [place] [address]    Report Status PENDING  Incomplete  Culture, blood (Routine X 2) w Reflex to ID Panel     Status: Abnormal   Collection Time: 01/18/19  1:48 AM   Specimen: BLOOD  Result Value Ref Range Status   Specimen Description   Final    BLOOD BLOOD RIGHT WRIST Performed at [facility], [address] [place], [place] [address]    Special Requests   Final    BOTTLES DRAWN AEROBIC AND ANAEROBIC Blood Culture adequate volume Performed at [facility], [address], [place], [place] [address]    Culture  Setup Time   Final    GRAM POSITIVE COCCI AEROBIC BOTTLE ONLY CRITICAL RESULT CALLED TO, READ BACK BY AND VERIFIED WITH: Lynelle Weiler,K AT 1751 ON 01/18/2019 BY MOSLEY,J Performed at [facility], [address] [place], [place] [address]    Culture (A)  Final    STAPHYLOCOCCUS SPECIES (COAGULASE NEGATIVE) THE SIGNIFICANCE OF ISOLATING THIS ORGANISM FROM A SINGLE SET OF BLOOD CULTURES WHEN MULTIPLE SETS ARE DRAWN IS UNCERTAIN. PLEASE NOTIFY THE MICROBIOLOGY DEPARTMENT WITHIN ONE WEEK IF SPECIATION AND SENSITIVITIES ARE REQUIRED. Performed at [facility], [address] [place], [place] [address]    Report Status 01/20/2019 FINAL  Final    RADIOLOGY:  No results found.   CODE STATUS:     Code Status Orders  (From admission, onward)         Start     Ordered   01/18/19 1647  Full code  Continuous     01/18/19 1647        Code Status History    This patient has a current code status but no historical code status.   Advance Care Planning Activity       TOTAL TIME TAKING CARE OF THIS PATIENT: *40* minutes.    Enedina Finner M.D  [facility]    CC: Primary care physician; [facility] [employer]

## 2019-01-22 NOTE — TOC Transition Note (Signed)
Transition of Care [facility]) - CM/SW Discharge Note   Patient Details  Name: Travis TARANTO Sr. MRN: [id number] Date of Birth: April 26, 1931  [facility]) CM/SW Contact:  Allayne Butcher, RN Phone Number: 01/22/2019, 12:00 PM   Clinical Narrative:    Home Health arranged with [facility] home health, Christy Gentles is aware of discharge today.  Patient's daughter Travis Webb will provide patient transportation home.     Final next level of care: Home w Home Health Services Barriers to Discharge: Barriers Resolved   Patient Goals and CMS Choice   CMS Medicare.gov Compare Post Acute Care list provided to:: Patient Represenative (must comment) Choice offered to / list presented to : Adult Children  Discharge Placement                       Discharge Plan and Services In-house Referral: Clinical Social Work Discharge Planning Services: Mobile Meals Post Acute Care Choice: Home Health          DME Arranged: Walker rolling DME Agency: [employer] Date DME Agency Contacted: 01/22/19 Time DME Agency Contacted: 1100 Representative spoke with at DME Agency: Mitchell Heir [facility] Arranged: PT HH Agency: [facility] Date [facility] Agency Contacted: 01/22/19 Time HH Agency Contacted: 1200 Representative spoke with at [facility] Agency: Christy Gentles  Social Determinants of Health (SDOH) Interventions     Readmission Risk Interventions No flowsheet data found.

## 2019-01-22 NOTE — Progress Notes (Signed)
Pt being discharged home with home health, discharge instructions in packet for daughter to review, daughter aware of packet and states she will review and call with questions, understands that pt should remain in quarantine at discharge, covid exitcare info given, pt with no complaints, daughter to come around 15-15:30 to transport pt

## 2019-01-22 NOTE — TOC Progression Note (Signed)
Transition of Care (TOC) - Progression Note    Patient Details  Name: Travis UMSTEAD Sr. MRN: [id number] Date of Birth: 1931-05-21  Transition [facility] Care [facility]) CM/SW Contact  Allayne Butcher, RN Phone Number: 01/22/2019, 11:00 AM  Clinical Narrative:    Plan for patient to discharge home today.  Daughter Travis Webb is agreeable to home health if can be arranged.  Referral for home health given to Feliberto Gottron with [facility] - he will let this RNCM know if they can accept.  Patient does not have a walker at home so one will be ordered and delivered to the patient's room by [employer].       Expected Discharge Plan: Home w Home Health Services    Expected Discharge Plan and Services Expected Discharge Plan: Home w Home Health Services In-house Referral: Clinical Social Work Discharge Planning Services: Mobile Meals Post Acute Care Choice: Home Health Living arrangements for the past 2 months: Single Family Home                 DME Arranged: Walker rolling DME Agency: [employer] Date DME Agency Contacted: 01/22/19 Time DME Agency Contacted: 1100 Representative spoke with at DME Agency: Mitchell Heir [facility] Arranged: PT           Social Determinants of Health (SDOH) Interventions    Readmission Risk Interventions No flowsheet data found.

## 2019-01-23 LAB — CULTURE, BLOOD (ROUTINE X 2): Culture: NO GROWTH

## 2019-08-04 DEATH — deceased

## 2020-07-08 IMAGING — CR DG CHEST 2V
1 series · 2 of 2 positions shown · non-contrast
Comparison: 04/20/2012

CLINICAL DATA: Weakness

EXAM:
CHEST - 2 VIEW

[Series 1: dg chest 2 view · 0.14mm/px · 2 of 2 slices shown]
[im 1/2]
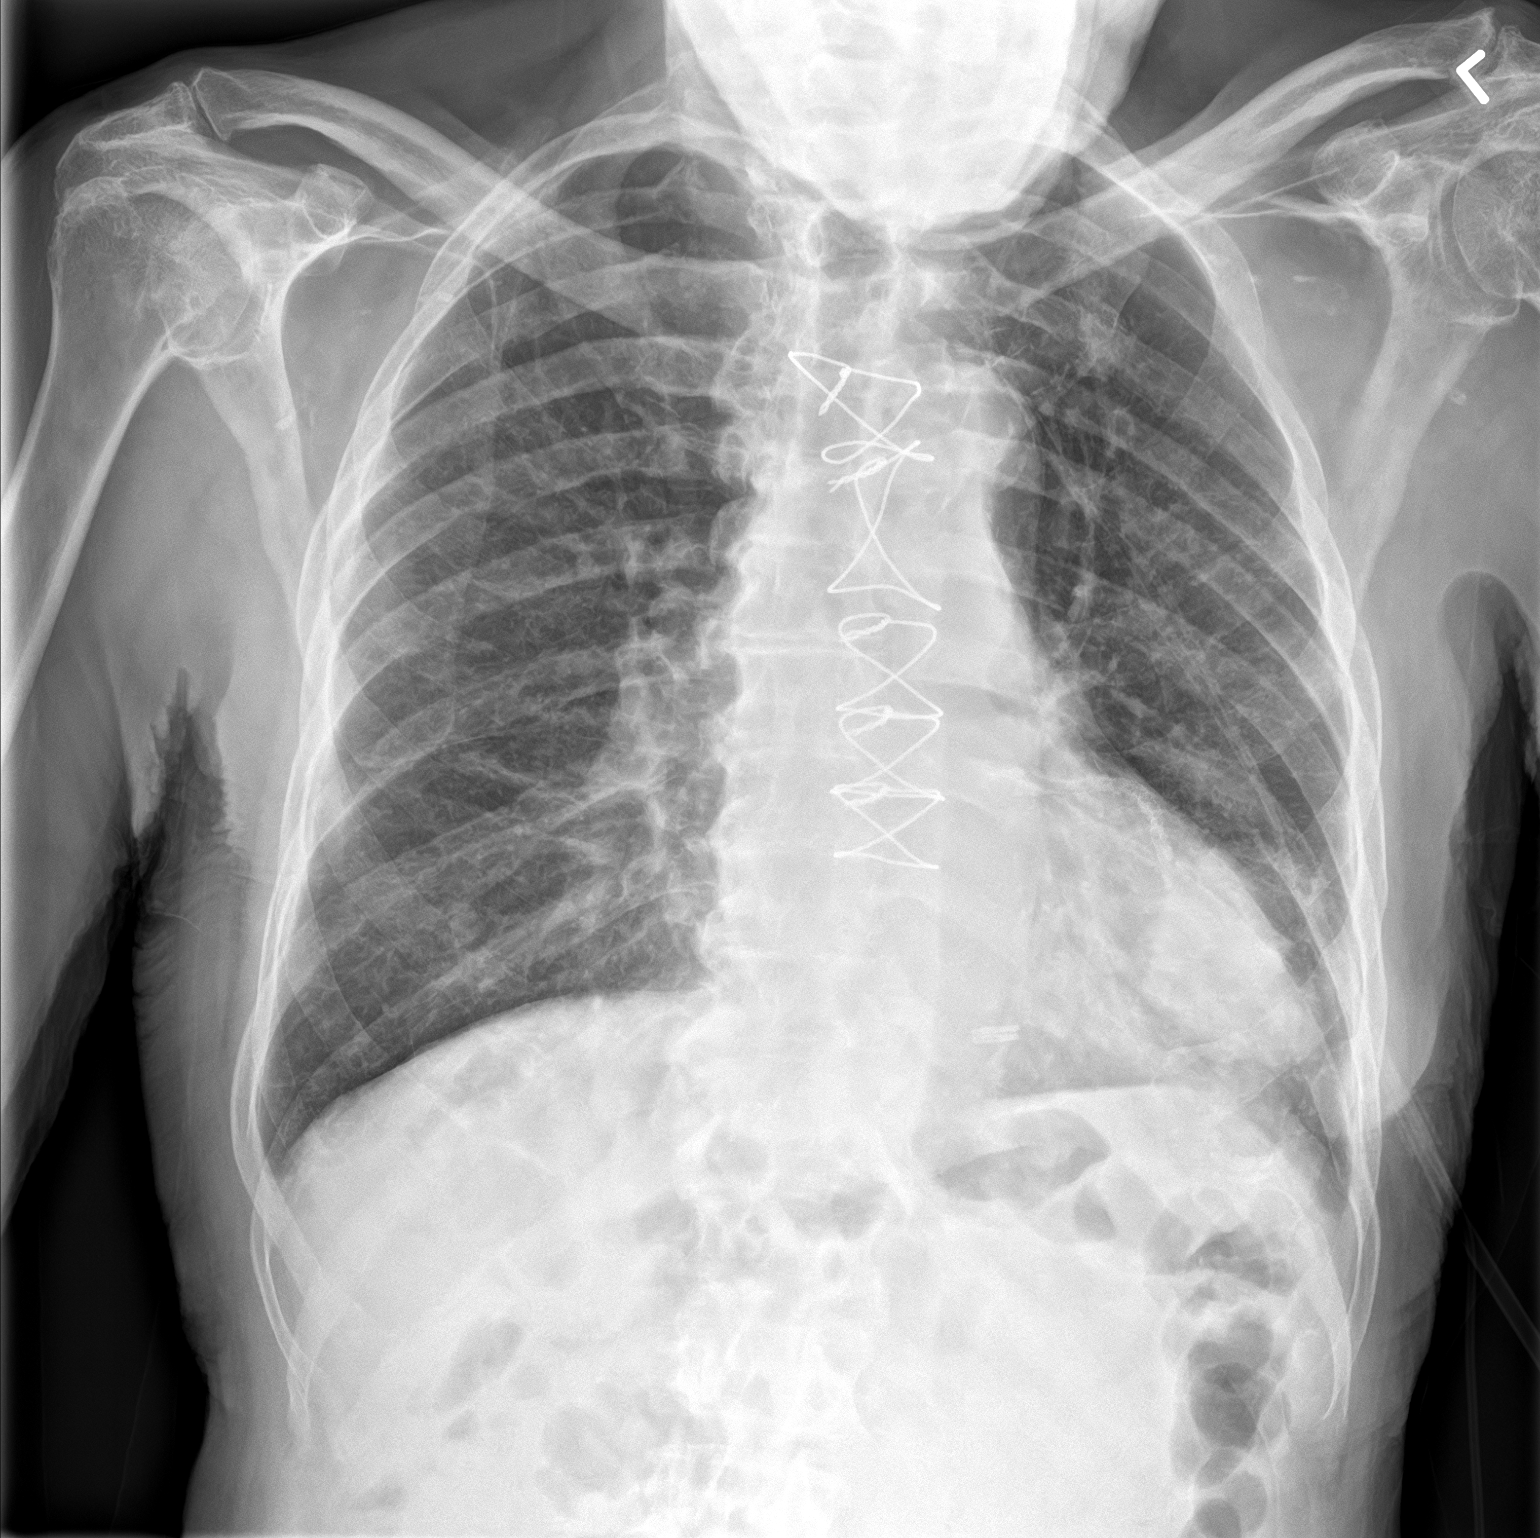
[im 2/2]
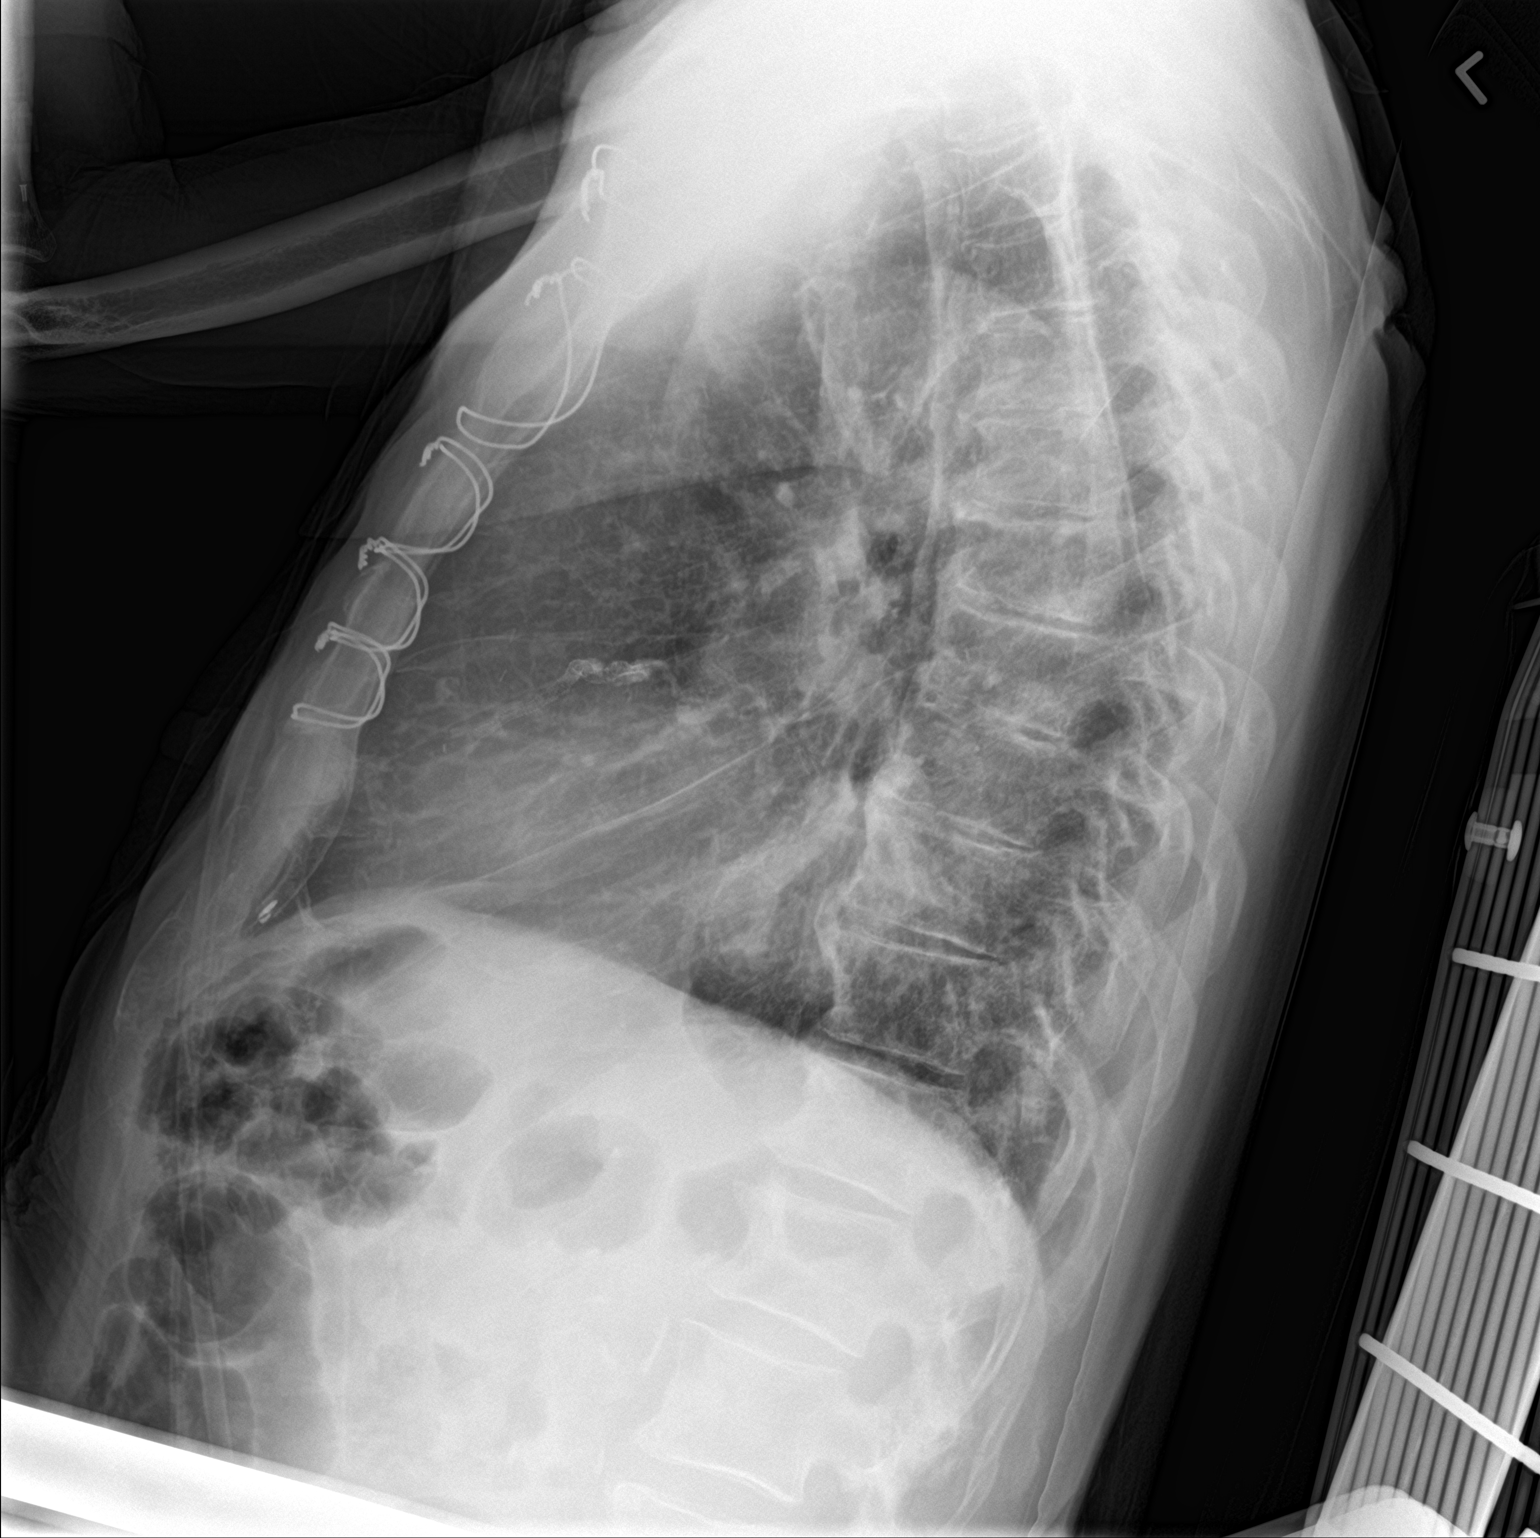

[2 of 2 positions shown; findings below may reference images not displayed]

FINDINGS: Post sternotomy changes. Focal opacity in the left infrahilar lung.
No pleural effusion. Normal heart size. Aortic atherosclerosis. No
pneumothorax.
IMPRESSION: 1. Focal opacity at the left lung base, possible focal pneumonia,
though mass lesion is also possible. Imaging follow-up to resolution
is recommended
